# Patient Record
Sex: Male | Born: 1960
Health system: Southern US, Community
[De-identification: ages and names within clinical notes are randomized; demographics above are authoritative.]

## PROBLEM LIST (undated history)

## (undated) DIAGNOSIS — I10 Essential (primary) hypertension: Secondary | ICD-10-CM

## (undated) DIAGNOSIS — E785 Hyperlipidemia, unspecified: Secondary | ICD-10-CM

## (undated) DIAGNOSIS — Z789 Other specified health status: Secondary | ICD-10-CM

## (undated) HISTORY — PX: KNEE ARTHROSCOPY: SUR90

## (undated) HISTORY — PX: WISDOM TOOTH EXTRACTION: SHX21

## (undated) HISTORY — DX: Other specified health status: Z78.9

## (undated) HISTORY — DX: Hyperlipidemia, unspecified: E78.5

## (undated) HISTORY — DX: Essential (primary) hypertension: I10

---

## 1989-03-02 HISTORY — PX: KNEE ARTHROSCOPY: SUR90

## 2016-06-26 DIAGNOSIS — F17200 Nicotine dependence, unspecified, uncomplicated: Secondary | ICD-10-CM | POA: Diagnosis not present

## 2016-06-26 DIAGNOSIS — Z681 Body mass index (BMI) 19 or less, adult: Secondary | ICD-10-CM | POA: Insufficient documentation

## 2016-06-26 DIAGNOSIS — L989 Disorder of the skin and subcutaneous tissue, unspecified: Secondary | ICD-10-CM | POA: Diagnosis not present

## 2016-07-16 DIAGNOSIS — H61002 Unspecified perichondritis of left external ear: Secondary | ICD-10-CM | POA: Diagnosis not present

## 2017-03-04 ENCOUNTER — Ambulatory Visit (INDEPENDENT_AMBULATORY_CARE_PROVIDER_SITE_OTHER): Payer: Self-pay

## 2017-03-04 ENCOUNTER — Encounter (INDEPENDENT_AMBULATORY_CARE_PROVIDER_SITE_OTHER): Payer: Self-pay | Admitting: Orthopaedic Surgery

## 2017-03-04 ENCOUNTER — Ambulatory Visit (INDEPENDENT_AMBULATORY_CARE_PROVIDER_SITE_OTHER): Payer: BLUE CROSS/BLUE SHIELD | Admitting: Orthopaedic Surgery

## 2017-03-04 VITALS — BP 156/88 | HR 93 | Resp 14 | Ht 67.0 in | Wt 165.0 lb

## 2017-03-04 DIAGNOSIS — M11262 Other chondrocalcinosis, left knee: Secondary | ICD-10-CM

## 2017-03-04 DIAGNOSIS — M25562 Pain in left knee: Secondary | ICD-10-CM

## 2017-03-04 MED ORDER — BUPIVACAINE HCL 0.5 % IJ SOLN
2.0000 mL | INTRAMUSCULAR | Status: AC | PRN
  Administered 2017-03-04: 2 mL via INTRA_ARTICULAR

## 2017-03-04 MED ORDER — METHYLPREDNISOLONE ACETATE 40 MG/ML IJ SUSP
80.0000 mg | INTRAMUSCULAR | Status: AC | PRN
  Administered 2017-03-04: 80 mg

## 2017-03-04 MED ORDER — LIDOCAINE HCL 1 % IJ SOLN
2.0000 mL | INTRAMUSCULAR | Status: AC | PRN
  Administered 2017-03-04: 2 mL

## 2017-03-04 MED ORDER — DICLOFENAC SODIUM 1 % TD GEL: 2.0000 g | Freq: Four times a day (QID) | TRANSDERMAL | 1 refills | Status: DC

## 2017-03-04 NOTE — Progress Notes (Signed)
Office Visit Note   Patient: Charles Hudson           Date of Birth: Jul 30, 1960           MRN: [id number] Visit Date: 03/04/2017              Requested by: No referring provider defined for this encounter. PCP: Patient, No Pcp Per   Assessment & Plan: Visit Diagnoses:  1. Chondrocalcinosis due to dicalcium phosphate crystals, of the knee, left   2. Acute pain of left knee     Plan:  #1: Corticosteroid injection was given to the left knee without difficulty. Improved pain. #2: Prescription for Voltaren gel that he can use intermittently as necessary. #3: If he is not improving then may consider MRI scan   Follow-Up Instructions: Return if symptoms worsen or fail to improve.   Orders:  Orders Placed This Encounter  Procedures  . XR Knee Complete 4 Views Left   Meds ordered this encounter  Medications  . diclofenac sodium (VOLTAREN) 1 % GEL    Sig: Apply 2-4 g topically 4 (four) times daily.    Dispense:  5 Tube    Refill:  1    Order Specific Question:   Supervising Provider    Answer:   Garald Balding [[id number]]      Procedures: Large Joint Inj: L knee on 03/04/2017 4:16 PM Indications: pain and diagnostic evaluation Details: 25 G 1.5 in needle, anteromedial approach  Arthrogram: No  Medications: 2 mL lidocaine 1 %; 2 mL bupivacaine 0.5 %; 80 mg methylPREDNISolone acetate 40 MG/ML Procedure, treatment alternatives, risks and benefits explained, specific risks discussed. Consent was given by the patient. Patient was prepped and draped in the usual sterile fashion.       Clinical Data: No additional findings.   Subjective: Chief Complaint  Patient presents with  . Left Knee - Pain, Edema    Charles Hudson is a 57 y o here today for Left knee pain x 2 weeks. Hx of Left knee meniscus repair and fatpad    HPI  [name] Is a very pleasant 57 year old white male who is seen today for evaluation of his left knee. [name] had a history of about 2 weeks of pain in the  medial aspect of his his left knee. Denies any recent history of injury or trauma. Denies any swelling at this time. Does not have any mechanical symptoms per se. The with his work it has been bothering him. He states nighttime is also where he has pain but then after several hours will improve.  Review of Systems  Constitutional: Negative for fatigue.  HENT: Negative for hearing loss.   Respiratory: Negative for apnea, chest tightness and shortness of breath.   Cardiovascular: Negative for chest pain, palpitations and leg swelling.  Gastrointestinal: Negative for blood in stool, constipation and diarrhea.  Genitourinary: Negative for difficulty urinating.  Musculoskeletal: Negative for arthralgias, back pain, joint swelling, myalgias, neck pain and neck stiffness.  Neurological: Negative for weakness, numbness and headaches.  Hematological: Does not bruise/bleed easily.  Psychiatric/Behavioral: Negative for sleep disturbance. The patient is not nervous/anxious.      Objective: Vital Signs: BP (!) 156/88   Pulse 93   Resp 14   Ht 5\' 7"  (1.702 m)   Wt 165 lb (74.8 kg)   BMI 25.84 kg/m   Physical Exam  Constitutional: He is oriented to person, place, and time. He appears well-developed and well-nourished.  HENT:  Head: Normocephalic  and atraumatic.  Eyes: EOM are normal. Pupils are equal, round, and reactive to light.  Pulmonary/Chest: Effort normal.  Neurological: He is alert and oriented to person, place, and time.  Skin: Skin is warm and dry.  Psychiatric: He has a normal mood and affect. His behavior is normal. Judgment and thought content normal.    Ortho Exam Exam today reveals no real effusion. Mottling in all the extremities. Range of motion from near full extension to 125. Tenderness over the medial compartment only at the joint line. No patellofemoral crepitance noted. McMurray's does give him a little bit of pain at the medial joint line. No pain laterally at all. His  cardiac smooth motion of both hips. No crepitance.   Specialty Comments:  No specialty comments available.  Imaging: Xr Knee Complete 4 Views Left  Result Date: 03/04/2017 4 view x-ray of the left knee reveals maintenance of the joint space. There is some sclerosing more at the medial tibial plateau. There are signs of chondrocalcinosis with calcification in the medial meniscus. Slight decrease in the medial joint line.    PMFS History: There are no active problems to display for this patient.  History reviewed. No pertinent past medical history.  Family History  Problem Relation Age of Onset  . Alzheimer's disease Father     Past Surgical History:  Procedure Laterality Date  . KNEE ARTHROSCOPY  1991  . KNEE ARTHROSCOPY    . WISDOM TOOTH EXTRACTION     Social History   Occupational History  . Not on file  Tobacco Use  . Smoking status: Current Every Day Smoker  . Smokeless tobacco: Never Used  Substance and Sexual Activity  . Alcohol use: Yes  . Drug use: No  . Sexual activity: Not on file

## 2017-03-05 ENCOUNTER — Telehealth: Payer: Self-pay

## 2017-03-05 ENCOUNTER — Other Ambulatory Visit (INDEPENDENT_AMBULATORY_CARE_PROVIDER_SITE_OTHER): Payer: Self-pay

## 2017-03-05 MED ORDER — DICLOFENAC SODIUM 1 % TD GEL: 2.0000 g | Freq: Four times a day (QID) | TRANSDERMAL | 1 refills | Status: DC

## 2017-03-05 NOTE — Telephone Encounter (Signed)
Resent today.

## 2017-03-05 NOTE — Telephone Encounter (Signed)
Patient called stating that Rx for Voltaren gel had not been received by pharmacy.  Pharmacy is [facility] in [place] on [address]

## 2017-03-11 ENCOUNTER — Telehealth (INDEPENDENT_AMBULATORY_CARE_PROVIDER_SITE_OTHER): Payer: Self-pay | Admitting: Orthopaedic Surgery

## 2017-03-11 NOTE — Telephone Encounter (Signed)
Patient's wife called stating they were told a prior authorization is required for their prescription of Voltarin Gel.  The Pharmacy they use is [facility] in [place] on [address].  Patient's CB# [phone]

## 2017-03-16 NOTE — Telephone Encounter (Signed)
LVMOM to call [facility] before  Pick up to make sure PA worked

## 2017-03-18 ENCOUNTER — Ambulatory Visit (INDEPENDENT_AMBULATORY_CARE_PROVIDER_SITE_OTHER): Payer: BLUE CROSS/BLUE SHIELD | Admitting: Orthopaedic Surgery

## 2017-03-18 ENCOUNTER — Other Ambulatory Visit (INDEPENDENT_AMBULATORY_CARE_PROVIDER_SITE_OTHER): Payer: Self-pay

## 2017-03-18 ENCOUNTER — Encounter (INDEPENDENT_AMBULATORY_CARE_PROVIDER_SITE_OTHER): Payer: Self-pay | Admitting: Orthopaedic Surgery

## 2017-03-18 VITALS — BP 154/88 | HR 90 | Resp 14 | Ht 67.0 in | Wt 125.0 lb

## 2017-03-18 DIAGNOSIS — M25562 Pain in left knee: Secondary | ICD-10-CM | POA: Diagnosis not present

## 2017-03-18 DIAGNOSIS — G8929 Other chronic pain: Secondary | ICD-10-CM

## 2017-03-18 NOTE — Progress Notes (Signed)
Office Visit Note   Patient: Charles Hudson           Date of Birth: 21-May-1960           MRN: [id number] Visit Date: 03/18/2017              Requested by: No referring provider defined for this encounter. PCP: Patient, No Pcp Per   Assessment & Plan: Visit Diagnoses:  1. Chronic pain of left knee     Plan: At least 2 month history of some left knee pain. Seen several weeks ago with evidence of chondrocalcinosis on the plain film. Given cortisone injection only lasted "a day". Has had several episodes of significant pain in his knee since that time to the point of compromise. [name] having quite a bit of pain in the posterior medial corner I'm concerned that he has a tear of the medial meniscus. He has a significant limp. I think at this point that he's had trouble for several months with treatment that's not responsive to cortisone and time and over-the-counter medicines . I think he has some significant intra-articular pathology and we will order an MRI scan. I think he is going to be candidate for knee arthroscopy  Follow-Up Instructions: Return after MRI.   Orders:  No orders of the defined types were placed in this encounter.  No orders of the defined types were placed in this encounter.     Procedures: No procedures performed   Clinical Data: No additional findings.   Subjective: Chief Complaint  Patient presents with  . Left Knee - Pain  . Knee Pain    Inj x 2 weeks, helped 1 day, worse now, swollen, red, difficulty walking, difficulty standing, no injury, not diabetic, arthroscopic x 2 1991, IBU helps  Tommy is had some trouble with his left knee for several months. He does a lot of work bending stooping and squatting but does not remember a specific injury. He was seen several weeks ago with a cortisone injection only lasted "a day". There was evidence of chondrocalcinosis by film he does not have an effusion but having quite a bit of pain particularly with  hyperflexion on the posterior medial corner of his knee. [name] had significant compromise of his work related activities and even try and find a comfortable position sleeping.  HPI  Review of Systems  Constitutional: Positive for activity change.  HENT: Negative for trouble swallowing.   Eyes: Negative for redness.  Respiratory: Negative for shortness of breath.   Cardiovascular: Positive for leg swelling.  Gastrointestinal: Negative for constipation.  Endocrine: Negative for cold intolerance.  Genitourinary: Negative for difficulty urinating.  Musculoskeletal: Positive for joint swelling.  Skin: Negative for rash.  Allergic/Immunologic: Negative for food allergies.  Neurological: Negative for weakness.  Hematological: Does not bruise/bleed easily.  Psychiatric/Behavioral: Negative for sleep disturbance.     Objective: Vital Signs: BP (!) 154/88 (BP Location: Left Arm, Patient Position: Sitting, Cuff Size: Normal)   Pulse 90   Resp 14   Ht 5\' 7"  (1.702 m)   Wt 125 lb (56.7 kg)   BMI 19.58 kg/m   Physical Exam  Ortho Exam awake alert and oriented 3. Comfortable sitting. Right knee without effusion. Has definite posterior medial corner discomfort. No instability. Little bit of patellar crepitation but no pain. No popliteal mass. No calf pain. Neurovascular exam intact. He does have quite a bit of pain when I extend his knee but no instability  Specialty Comments:  No specialty  comments available.  Imaging: No results found.   PMFS History: There are no active problems to display for this patient.  History reviewed. No pertinent past medical history.  Family History  Problem Relation Age of Onset  . Alzheimer's disease Father     Past Surgical History:  Procedure Laterality Date  . KNEE ARTHROSCOPY  1991  . KNEE ARTHROSCOPY    . WISDOM TOOTH EXTRACTION     Social History   Occupational History  . Not on file  Tobacco Use  . Smoking status: Current Every Day  Smoker    Packs/day: 1.50    Years: 30.00    Pack years: 45.00    Types: Cigarettes  . Smokeless tobacco: Never Used  Substance and Sexual Activity  . Alcohol use: Yes    Alcohol/week: 16.8 oz    Types: 28 Cans of beer per week  . Drug use: No  . Sexual activity: Not on file

## 2017-03-29 ENCOUNTER — Ambulatory Visit
Admission: RE | Admit: 2017-03-29 | Discharge: 2017-03-29 | Disposition: A | Discharge status: Home / Self Care | Payer: BLUE CROSS/BLUE SHIELD | Source: Ambulatory Visit | Attending: Orthopaedic Surgery | Admitting: Orthopaedic Surgery

## 2017-03-29 ENCOUNTER — Telehealth (INDEPENDENT_AMBULATORY_CARE_PROVIDER_SITE_OTHER): Payer: Self-pay | Admitting: Orthopaedic Surgery

## 2017-03-29 DIAGNOSIS — G8929 Other chronic pain: Secondary | ICD-10-CM

## 2017-03-29 DIAGNOSIS — M25562 Pain in left knee: Secondary | ICD-10-CM | POA: Diagnosis not present

## 2017-03-29 NOTE — Telephone Encounter (Signed)
Patient's wife Ivin Booty left a voicemail returning a call to the office.  She will have her cell phone with her if you are able to call back.  CB# [phone]

## 2017-04-01 ENCOUNTER — Telehealth (INDEPENDENT_AMBULATORY_CARE_PROVIDER_SITE_OTHER): Payer: Self-pay | Admitting: Orthopaedic Surgery

## 2017-04-01 NOTE — Telephone Encounter (Signed)
Patient's wife Ivin Booty left a voicemail stating she missed the call from Dr. Durward Fortes about Tommy's MRI.  She stated that she will keep her phone with her today so she doesn't miss another call.

## 2017-04-05 ENCOUNTER — Encounter (INDEPENDENT_AMBULATORY_CARE_PROVIDER_SITE_OTHER): Payer: Self-pay | Admitting: Orthopaedic Surgery

## 2017-04-05 ENCOUNTER — Ambulatory Visit (INDEPENDENT_AMBULATORY_CARE_PROVIDER_SITE_OTHER): Payer: BLUE CROSS/BLUE SHIELD | Admitting: Orthopaedic Surgery

## 2017-04-05 VITALS — BP 149/83 | HR 88 | Resp 12 | Ht 66.0 in | Wt 125.0 lb

## 2017-04-05 DIAGNOSIS — M25562 Pain in left knee: Secondary | ICD-10-CM | POA: Diagnosis not present

## 2017-04-05 DIAGNOSIS — G8929 Other chronic pain: Secondary | ICD-10-CM | POA: Diagnosis not present

## 2017-04-05 NOTE — Progress Notes (Signed)
Office Visit Note   Patient: Charles Hudson           Date of Birth: 02-Aug-1960           MRN: [id number] Visit Date: 04/05/2017              Requested by: No referring provider defined for this encounter. PCP: Patient, No Pcp Per   Assessment & Plan: Visit Diagnoses:  1. Chronic pain of left knee     Plan: MRI scan demonstrates a horizontal tear of the posterior horn and body of the medial meniscus extending to the superior articular surface. There is also partial thickness cartilage loss of the medial femoral tibial compartment. Problem is localized along the posterior medial compartment left knee consistent with the MRI scan findings. Long discussion regarding the MRI scan. Tommy would like to proceed with a knee arthroscopy. Discussed this in some detail. We'll schedule  Follow-Up Instructions: Return will schedule left knee arthroscopy.   Orders:  No orders of the defined types were placed in this encounter.  No orders of the defined types were placed in this encounter.     Procedures: No procedures performed   Clinical Data: No additional findings.   Subjective: Chief Complaint  Patient presents with  . Left Knee - Pain, Results, Weakness    Charles Hudson is a 57 y o here today for MRI results left knee. Hx of left knee arthroscopy in 1991.  Several month history of left knee pain and has evidence of chondrocalcinosis on plain film. Cortisone injection was not helpful. Has had an MRI scan demonstrating a horizontal tear of the medial meniscus associated with some cartilage loss in the medial compartment. It seems that his pain is more related to the meniscal tear. Has tried over-the-counter medicines and even a knee support.  HPI  Review of Systems   Objective: Vital Signs: BP (!) 149/83   Pulse 88   Resp 12   Ht 5\' 6"  (1.676 m)   Wt 125 lb (56.7 kg)   BMI 20.18 kg/m   Physical Exam  Ortho Exam awake alert and oriented 3. Comfortable sitting. No left knee  effusion. Predominantly posterior medial joint pain. No pain anterior medially or laterally. No patella crepitation. Full knee extension and flexed over 105. No instability. No popliteal pain. No calf pain. Neurovascular exam intact  Specialty Comments:  No specialty comments available.  Imaging: No results found.   PMFS History: There are no active problems to display for this patient.  History reviewed. No pertinent past medical history.  Family History  Problem Relation Age of Onset  . Alzheimer's disease Father     Past Surgical History:  Procedure Laterality Date  . KNEE ARTHROSCOPY  1991  . KNEE ARTHROSCOPY    . WISDOM TOOTH EXTRACTION     Social History   Occupational History  . Not on file  Tobacco Use  . Smoking status: Current Every Day Smoker    Packs/day: 1.50    Years: 30.00    Pack years: 45.00    Types: Cigarettes  . Smokeless tobacco: Never Used  Substance and Sexual Activity  . Alcohol use: Yes    Alcohol/week: 16.8 oz    Types: 28 Cans of beer per week  . Drug use: No  . Sexual activity: Not on file     Garald Balding, MD   Note - This record has been created using [employer].  Chart creation errors have been sought,  but may not always  have been located. Such creation errors do not reflect on  the standard of medical care.

## 2017-04-15 ENCOUNTER — Encounter: Payer: Self-pay | Admitting: Orthopaedic Surgery

## 2017-04-15 DIAGNOSIS — G8918 Other acute postprocedural pain: Secondary | ICD-10-CM | POA: Diagnosis not present

## 2017-04-15 DIAGNOSIS — M94262 Chondromalacia, left knee: Secondary | ICD-10-CM | POA: Diagnosis not present

## 2017-04-15 DIAGNOSIS — M659 Synovitis and tenosynovitis, unspecified: Secondary | ICD-10-CM | POA: Diagnosis not present

## 2017-04-15 DIAGNOSIS — M23321 Other meniscus derangements, posterior horn of medial meniscus, right knee: Secondary | ICD-10-CM | POA: Diagnosis not present

## 2017-04-15 DIAGNOSIS — M23222 Derangement of posterior horn of medial meniscus due to old tear or injury, left knee: Secondary | ICD-10-CM | POA: Diagnosis not present

## 2017-04-15 DIAGNOSIS — M6752 Plica syndrome, left knee: Secondary | ICD-10-CM | POA: Diagnosis not present

## 2017-04-15 DIAGNOSIS — M11162 Familial chondrocalcinosis, left knee: Secondary | ICD-10-CM | POA: Diagnosis not present

## 2017-04-19 ENCOUNTER — Encounter (INDEPENDENT_AMBULATORY_CARE_PROVIDER_SITE_OTHER): Payer: Self-pay | Admitting: Orthopaedic Surgery

## 2017-04-19 ENCOUNTER — Ambulatory Visit (INDEPENDENT_AMBULATORY_CARE_PROVIDER_SITE_OTHER): Payer: BLUE CROSS/BLUE SHIELD | Admitting: Orthopaedic Surgery

## 2017-04-19 VITALS — BP 132/76 | HR 106 | Resp 14 | Ht 69.0 in | Wt 125.0 lb

## 2017-04-19 DIAGNOSIS — M25562 Pain in left knee: Secondary | ICD-10-CM

## 2017-04-19 DIAGNOSIS — G8929 Other chronic pain: Secondary | ICD-10-CM

## 2017-04-19 NOTE — Progress Notes (Signed)
Office Visit Note   Patient: Charles Hudson           Date of Birth: 28-Jul-1960           MRN: [id number] Visit Date: 04/19/2017              Requested by: No referring provider defined for this encounter. PCP: Patient, No Pcp Per   Assessment & Plan: Visit Diagnoses:  1. Chronic pain of left knee     Plan: 4 days status post left knee arthroscopy. Had tear of the medial meniscus that was debrided. Actually doing quite well and not having much pain. Having some difficulty with his shoulders with use of the walker. Activities as tolerated office 2 weeks. No work  Follow-Up Instructions: Return in about 2 weeks (around 05/03/2017).   Orders:  No orders of the defined types were placed in this encounter.  No orders of the defined types were placed in this encounter.     Procedures: No procedures performed   Clinical Data: No additional findings.   Subjective: Chief Complaint  Patient presents with  . Left Knee - Routine Post Op    Charles Hudson is a 57 y o S/P 4 days Left knee arthroscopy.  He relates no real pain, no fever, chills or calf pain. Take ibuprofen PRN for swelling.  No fever or chills shortness of breath or chest pain. Not taking pain medicines.  HPI  Review of Systems  Constitutional: Negative for fatigue.  HENT: Negative for hearing loss.   Respiratory: Negative for apnea, chest tightness and shortness of breath.   Cardiovascular: Negative for chest pain, palpitations and leg swelling.  Gastrointestinal: Negative for blood in stool, constipation and diarrhea.  Genitourinary: Negative for difficulty urinating.  Musculoskeletal: Negative for arthralgias, back pain, joint swelling, myalgias, neck pain and neck stiffness.  Neurological: Negative for weakness, numbness and headaches.  Hematological: Does not bruise/bleed easily.  Psychiatric/Behavioral: Negative for sleep disturbance. The patient is not nervous/anxious.      Objective: Vital Signs: BP  132/76   Pulse (!) 106   Resp 14   Ht 5\' 9"  (1.753 m)   Wt 125 lb (56.7 kg)   BMI 18.46 kg/m   Physical Exam  Ortho Exam awake alert and oriented 3. Comfortable sitting. Walks slowly with a walker. He is applying partial weightbearing to the left lower extremity. Arthroscopic portals are clean and dry. Small effusion. Full extension about 95 of flexion. No calf pain. No popliteal discomfort. No distal edema.  Specialty Comments:  No specialty comments available.  Imaging: No results found.   PMFS History: There are no active problems to display for this patient.  History reviewed. No pertinent past medical history.  Family History  Problem Relation Age of Onset  . Alzheimer's disease Father     Past Surgical History:  Procedure Laterality Date  . KNEE ARTHROSCOPY  1991  . KNEE ARTHROSCOPY    . WISDOM TOOTH EXTRACTION     Social History   Occupational History  . Not on file  Tobacco Use  . Smoking status: Current Every Day Smoker    Packs/day: 1.50    Years: 30.00    Pack years: 45.00    Types: Cigarettes  . Smokeless tobacco: Never Used  Substance and Sexual Activity  . Alcohol use: Yes    Alcohol/week: 16.8 oz    Types: 28 Cans of beer per week  . Drug use: No  . Sexual activity: Not on file

## 2017-05-03 ENCOUNTER — Ambulatory Visit (INDEPENDENT_AMBULATORY_CARE_PROVIDER_SITE_OTHER): Payer: BLUE CROSS/BLUE SHIELD | Admitting: Orthopaedic Surgery

## 2017-05-03 ENCOUNTER — Encounter (INDEPENDENT_AMBULATORY_CARE_PROVIDER_SITE_OTHER): Payer: Self-pay | Admitting: Orthopaedic Surgery

## 2017-05-03 VITALS — BP 139/76 | HR 89 | Resp 14 | Ht 66.0 in | Wt 122.0 lb

## 2017-05-03 DIAGNOSIS — G8929 Other chronic pain: Secondary | ICD-10-CM

## 2017-05-03 DIAGNOSIS — M25562 Pain in left knee: Secondary | ICD-10-CM

## 2017-05-03 NOTE — Progress Notes (Signed)
Office Visit Note   Patient: Charles Hudson           Date of Birth: 1960-03-21           MRN: [id number] Visit Date: 05/03/2017              Requested by: No referring provider defined for this encounter. PCP: Patient, No Pcp Per   Assessment & Plan: Visit Diagnoses:  1. Chronic pain of left knee     Plan: 2-1/2 weeks status post left knee arthroscopy with debridement of a medial meniscal tear. Doing well. No longer uses any ambulatory aid. Needs to work on range of motion and strengthening exercises. We will keep him out of work until he returns to see me in 2 weeks. Patient does not wish to go to physical therapy but will do his exercises at home  Follow-Up Instructions: Return in about 2 weeks (around 05/17/2017).   Orders:  No orders of the defined types were placed in this encounter.  No orders of the defined types were placed in this encounter.     Procedures: No procedures performed   Clinical Data: No additional findings.   Subjective: Chief Complaint  Patient presents with  . Left Knee - Routine Post Op    Charles Hudson is 32 for 2 week f u post op surgery of l knee. No issues.  2-1/2 weeks status post left knee arthroscopy for debridement of a medial meniscal tear. Presently doing well with minimal pain. No fever or chills shortness of breath or chest pain. Has been out of work since before surgery.  HPI  Review of Systems  Constitutional: Negative for fatigue and fever.  HENT: Negative for ear pain and tinnitus.   Eyes: Negative for pain.  Respiratory: Negative for cough and shortness of breath.   Gastrointestinal: Negative for blood in stool, constipation and diarrhea.  Musculoskeletal: Negative for back pain and neck pain.  Allergic/Immunologic: Negative for food allergies.  Neurological: Negative for weakness and numbness.  Hematological: Does not bruise/bleed easily.     Objective: Vital Signs: BP 139/76 (BP Location: Left Arm, Patient Position:  Sitting, Cuff Size: Normal)   Pulse 89   Resp 14   Ht 5\' 6"  (1.676 m)   Wt 122 lb (55.3 kg)   BMI 19.69 kg/m   Physical Exam  Ortho Exam awake alert and oriented 3. Comfortable sitting left knee with minimal effusion. Arthroscopic portals healing without problem. No calf pain. No distal edema. Lacks just a few degrees to full extension and flexed over 90. No instability. No localizers of tenderness. He does have some weakness of the quads  Specialty Comments:  No specialty comments available.  Imaging: No results found.   PMFS History: There are no active problems to display for this patient.  History reviewed. No pertinent past medical history.  Family History  Problem Relation Age of Onset  . Alzheimer's disease Father     Past Surgical History:  Procedure Laterality Date  . KNEE ARTHROSCOPY  1991  . KNEE ARTHROSCOPY    . WISDOM TOOTH EXTRACTION     Social History   Occupational History  . Not on file  Tobacco Use  . Smoking status: Current Every Day Smoker    Packs/day: 1.50    Years: 30.00    Pack years: 45.00    Types: Cigarettes  . Smokeless tobacco: Never Used  Substance and Sexual Activity  . Alcohol use: Yes    Alcohol/week: 16.8 oz  Types: 28 Cans of beer per week  . Drug use: No  . Sexual activity: Not on file

## 2017-05-17 ENCOUNTER — Ambulatory Visit (INDEPENDENT_AMBULATORY_CARE_PROVIDER_SITE_OTHER): Payer: BLUE CROSS/BLUE SHIELD | Admitting: Orthopaedic Surgery

## 2017-05-17 ENCOUNTER — Encounter (INDEPENDENT_AMBULATORY_CARE_PROVIDER_SITE_OTHER): Payer: Self-pay | Admitting: Orthopaedic Surgery

## 2017-05-17 VITALS — BP 158/93 | HR 89 | Resp 18 | Ht 66.0 in | Wt 125.0 lb

## 2017-05-17 DIAGNOSIS — G8929 Other chronic pain: Secondary | ICD-10-CM

## 2017-05-17 DIAGNOSIS — M25562 Pain in left knee: Secondary | ICD-10-CM

## 2017-05-17 NOTE — Progress Notes (Signed)
Office Visit Note   Patient: Charles Hudson           Date of Birth: 10/02/1960           MRN: [id number] Visit Date: 05/17/2017              Requested by: No referring provider defined for this encounter. PCP: Patient, No Pcp Per   Assessment & Plan: Visit Diagnoses:  1. Chronic pain of left knee     Plan: Over 4 weeks status post left knee arthroscopy with partial medial meniscectomy.  Doing well.  Would like to return to work March 25.  Will issue note to that effect and plan to see him back as needed  Follow-Up Instructions: Return if symptoms worsen or fail to improve.   Orders:  No orders of the defined types were placed in this encounter.  No orders of the defined types were placed in this encounter.     Procedures: No procedures performed   Clinical Data: No additional findings.   Subjective: Chief Complaint  Patient presents with  . Left Knee - Pain    MT Shapley IS 57 Y O M HERE FOR LEFT KNEE PAIN 04-15-17 NO ISSUES.   . Right Shoulder - Pain    MR Ossa IS 30 Y O M HERE FOR BIL LAT SHOULDER PAIN NO INJURY, STARTED BOTHERING HIM BEFORE HIS KNEE SUR 04-15-17.  Marland Kitchen Left Shoulder - Pain    MR Deboard IS 24 Y O M HERE FOR BIL LAT SHOULDER PAIN NO INJURY, STARTED BOTHERING HIM BEFORE HIS KNEE SUR 04-15-17.  Are without related issues over a month status post left knee arthroscopy for tear of the medial meniscus.  Not having any appreciable pain.  Using any ambulatory aid.  Not taking any pain meds  HPI  Review of Systems  Constitutional: Negative for fatigue and fever.  HENT: Negative for ear pain.   Eyes: Negative for pain.  Respiratory: Negative for cough and shortness of breath.   Cardiovascular: Negative for leg swelling.  Gastrointestinal: Negative for blood in stool, constipation and diarrhea.  Genitourinary: Negative for dysuria.  Musculoskeletal: Negative for back pain and neck pain.  Skin: Negative for rash and wound.  Allergic/Immunologic: Negative for  food allergies.  Neurological: Negative for dizziness, weakness, light-headedness, numbness and headaches.  Hematological: Does not bruise/bleed easily.  Psychiatric/Behavioral: Negative for sleep disturbance.     Objective: Vital Signs: BP (!) 158/93 (BP Location: Left Arm, Patient Position: Sitting, Cuff Size: Normal)   Pulse 89   Resp 18   Ht 5\' 6"  (1.676 m)   Wt 125 lb (56.7 kg)   BMI 20.18 kg/m   Physical Exam  Ortho Exam left knee without effusion.  Arthroscopic portals of healed without problem.  No medial lateral joint pain.  No instability.  Flexed over 110 degrees without instability.  Full extension.  Neurovascular exam intact  Specialty Comments:  No specialty comments available.  Imaging: No results found.   PMFS History: There are no active problems to display for this patient.  History reviewed. No pertinent past medical history.  Family History  Problem Relation Age of Onset  . Alzheimer's disease Father     Past Surgical History:  Procedure Laterality Date  . KNEE ARTHROSCOPY  1991  . KNEE ARTHROSCOPY    . WISDOM TOOTH EXTRACTION     Social History   Occupational History  . Not on file  Tobacco Use  . Smoking status: Current Every  Day Smoker    Packs/day: 1.50    Years: 30.00    Pack years: 45.00    Types: Cigarettes  . Smokeless tobacco: Never Used  Substance and Sexual Activity  . Alcohol use: Yes    Alcohol/week: 16.8 oz    Types: 28 Cans of beer per week  . Drug use: No  . Sexual activity: Not on file

## 2017-12-13 ENCOUNTER — Ambulatory Visit: Payer: BLUE CROSS/BLUE SHIELD | Admitting: Family Medicine

## 2017-12-27 ENCOUNTER — Ambulatory Visit: Payer: BLUE CROSS/BLUE SHIELD | Admitting: Family Medicine

## 2017-12-27 ENCOUNTER — Encounter: Payer: Self-pay | Admitting: Family Medicine

## 2017-12-27 VITALS — BP 149/84 | HR 78 | Temp 98.6°F | Resp 16 | Ht 66.0 in | Wt 125.0 lb

## 2017-12-27 DIAGNOSIS — Z720 Tobacco use: Secondary | ICD-10-CM | POA: Diagnosis not present

## 2017-12-27 DIAGNOSIS — Z125 Encounter for screening for malignant neoplasm of prostate: Secondary | ICD-10-CM | POA: Diagnosis not present

## 2017-12-27 DIAGNOSIS — F17210 Nicotine dependence, cigarettes, uncomplicated: Secondary | ICD-10-CM | POA: Insufficient documentation

## 2017-12-27 DIAGNOSIS — Z23 Encounter for immunization: Secondary | ICD-10-CM

## 2017-12-27 DIAGNOSIS — Z1211 Encounter for screening for malignant neoplasm of colon: Secondary | ICD-10-CM

## 2017-12-27 DIAGNOSIS — N5089 Other specified disorders of the male genital organs: Secondary | ICD-10-CM

## 2017-12-27 DIAGNOSIS — Z0000 Encounter for general adult medical examination without abnormal findings: Secondary | ICD-10-CM

## 2017-12-27 DIAGNOSIS — R030 Elevated blood-pressure reading, without diagnosis of hypertension: Secondary | ICD-10-CM

## 2017-12-27 NOTE — Progress Notes (Signed)
Chief Complaint  Patient presents with  . New Patient (Initial Visit)    here to stablish care    Well Male Charles Hudson is here for a complete physical.   His last physical was >1 year ago.  Current diet: in general, a "good" diet.  Current exercise: physically active at work Weight trend: stable Daytime fatigue? No. Seat belt? Yes.    Health maintenance Shingrix- No Colonoscopy- No Tetanus- No HIV- Yes - 1987 Hep C- Yes -1987 Lung cancer screening- No Prostate cancer screening- No   History reviewed. No pertinent past medical history.    Past Surgical History:  Procedure Laterality Date  . KNEE ARTHROSCOPY  1991  . KNEE ARTHROSCOPY    . WISDOM TOOTH EXTRACTION      Medications  Takes no meds routinely.   Allergies No Known Allergies  Family History Family History  Problem Relation Age of Onset  . Alzheimer's disease Father     Review of Systems: Constitutional:  no fevers Eye:  no recent significant change in vision Ear/Nose/Mouth/Throat:  Ears:  no hearing loss Nose/Mouth/Throat:  no complaints of nasal congestion, no sore throat Cardiovascular:  no chest pain, no palpitations Respiratory:  no cough and no shortness of breath Gastrointestinal:  no abdominal pain, no change in bowel habits GU:  Male: negative for dysuria, frequency, and incontinence and negative for prostate symptoms Musculoskeletal/Extremities:  no pain, redness, or swelling of the joints Integumentary (Skin/Breast):  no abnormal skin lesions reported Neurologic:  no headaches Endocrine: No unexpected weight changes Hematologic/Lymphatic:  no abnormal bleeding  Exam BP (!) 149/84 (BP Location: Right Arm, Patient Position: Sitting, Cuff Size: Small)   Pulse 78   Temp 98.6 F (37 C) (Oral)   Resp 16   Ht 5\' 6"  (1.676 m)   Wt 125 lb (56.7 kg)   SpO2 100%   BMI 20.18 kg/m  General:  well developed, well nourished, in no apparent distress Skin:  no significant moles, warts, or  growths Head:  no masses, lesions, or tenderness Eyes:  pupils equal and round, sclera anicteric without injection Ears:  canals without lesions, TMs shiny without retraction, no obvious effusion, no erythema Nose:  nares patent, septum midline, mucosa normal Throat/Pharynx:  lips and gingiva without lesion; tongue and uvula midline; non-inflamed pharynx; no exudates or postnasal drainage Neck: neck supple without adenopathy, thyromegaly, or masses Lungs:  clear to auscultation, breath sounds equal bilaterally, no respiratory distress Cardio:  regular rate and rhythm, no LE edema, no bruits Abdomen:  abdomen soft, nontender; bowel sounds normal; no masses or organomegaly Genital (male): circumcised penis, no lesions or discharge; testes present bilaterally with L sided mass, no tenderness Rectal: Deferred Musculoskeletal:  symmetrical muscle groups noted without atrophy or deformity Extremities:  no clubbing, cyanosis, or edema, no deformities, no skin discoloration Neuro:  gait normal; deep tendon reflexes normal and symmetric Psych: well oriented with normal range of affect and appropriate judgment/insight  Assessment and Plan  Well adult exam - Plan: Lipid panel, Comprehensive metabolic panel  Tobacco abuse  Screen for colon cancer - Plan: Ambulatory referral to Gastroenterology  Smoking greater than 30 pack years - Plan: CT CHEST LUNG CANCER SCREENING LOW DOSE WO CONTRAST  Need for diphtheria-tetanus-pertussis (Tdap) vaccine - Plan: Tdap vaccine greater than or equal to 7yo IM  Testicular mass - Plan: US Scrotum  Elevated blood pressure reading  Screening for prostate cancer - Plan: PSA   [name]. Counseled on diet and exercise.  Counseled on risks and benefits of prostate cancer screening with PSA. The patient agrees to undergo testing. Immunizations, labs, and further orders as above. Ck [place] for testicular mass, likely vessel related. Counseled on tobacco  cessation.  I offered both Chantix and nicotine supplementation which he declined at this time wishing to speak with his wife first. He accepted the tetanus shot but refused a flu shot.  Also refused pneumonia vaccine stating he only wanted one poke today. Follow up in 1 yr pending above. The patient voiced understanding and agreement to the plan.   Sabana Eneas, DO 12/27/17 4:41 PM

## 2017-12-27 NOTE — Patient Instructions (Addendum)
Give [place] 2-3 business days to get the results of your labs back.   The new Shingrix vaccine (for shingles) is a 2 shot series. It can make people feel low energy, achy and almost like they have the flu for 48 hours after injection. Please plan accordingly when deciding on when to get this shot. Call our office for a nurse visit appointment to get this. The second shot of the series is less severe regarding the side effects, but it still lasts 48 hours.   Get a flu shot.   Keep the diet clean.  OK to use Debrox (peroxide) in the ear to loosen up wax. Also recommend using a bulb syringe (for removing boogers from baby's noses) to flush through warm water. Do not use Q-tips as this can impact wax further.  Let me know if you would like to start anything for smoking cessation. Chantix or patches would be what I recommend.  If you do not hear anything about your referral in the next 1-2 weeks, call our office and ask for an update.  Let us know if you need anything.

## 2017-12-28 ENCOUNTER — Other Ambulatory Visit: Payer: Self-pay | Admitting: Family Medicine

## 2017-12-28 DIAGNOSIS — N5089 Other specified disorders of the male genital organs: Secondary | ICD-10-CM

## 2017-12-29 ENCOUNTER — Telehealth: Payer: Self-pay | Admitting: *Deleted

## 2017-12-29 NOTE — Telephone Encounter (Signed)
FYI: New patient 12/27/17, did not get labs drawn after appointment; [facility] has scheduled him for 01/05/18 at 8:25am for lab. Patient's wife was on the phone wanting to get pt's Flu vaccine given that same day, scheduled pt for 3:15 pm d/t no Am availability slot but informed [facility] to let patient know to ask for me after his labs [I will try to getin to lab while he is still there] and I will give him his Influenza immunization. Also, to inform patient nothing to eat or drink, with exception of water that morning, as he has a fasting lab ordered [Lipid]; caller understood & agreed/SLS 10/30

## 2018-01-05 ENCOUNTER — Ambulatory Visit (HOSPITAL_BASED_OUTPATIENT_CLINIC_OR_DEPARTMENT_OTHER)
Admission: RE | Admit: 2018-01-05 | Discharge: 2018-01-05 | Disposition: A | Discharge status: Home / Self Care | Payer: BLUE CROSS/BLUE SHIELD | Source: Ambulatory Visit | Attending: Family Medicine | Admitting: Family Medicine

## 2018-01-05 ENCOUNTER — Other Ambulatory Visit (INDEPENDENT_AMBULATORY_CARE_PROVIDER_SITE_OTHER): Payer: BLUE CROSS/BLUE SHIELD

## 2018-01-05 ENCOUNTER — Ambulatory Visit (INDEPENDENT_AMBULATORY_CARE_PROVIDER_SITE_OTHER): Payer: BLUE CROSS/BLUE SHIELD

## 2018-01-05 DIAGNOSIS — I700 Atherosclerosis of aorta: Secondary | ICD-10-CM | POA: Diagnosis not present

## 2018-01-05 DIAGNOSIS — Z125 Encounter for screening for malignant neoplasm of prostate: Secondary | ICD-10-CM | POA: Diagnosis not present

## 2018-01-05 DIAGNOSIS — N5089 Other specified disorders of the male genital organs: Secondary | ICD-10-CM

## 2018-01-05 DIAGNOSIS — R9389 Abnormal findings on diagnostic imaging of other specified body structures: Secondary | ICD-10-CM | POA: Insufficient documentation

## 2018-01-05 DIAGNOSIS — Z23 Encounter for immunization: Secondary | ICD-10-CM

## 2018-01-05 DIAGNOSIS — N509 Disorder of male genital organs, unspecified: Secondary | ICD-10-CM | POA: Insufficient documentation

## 2018-01-05 DIAGNOSIS — Z122 Encounter for screening for malignant neoplasm of respiratory organs: Secondary | ICD-10-CM | POA: Diagnosis not present

## 2018-01-05 DIAGNOSIS — F17210 Nicotine dependence, cigarettes, uncomplicated: Secondary | ICD-10-CM

## 2018-01-05 DIAGNOSIS — J432 Centrilobular emphysema: Secondary | ICD-10-CM | POA: Diagnosis not present

## 2018-01-05 LAB — COMPREHENSIVE METABOLIC PANEL
ALK PHOS: 42 U/L (ref 39–117)
ALT: 15 U/L (ref 0–53)
AST: 25 U/L (ref 0–37)
Albumin: 4.4 g/dL (ref 3.5–5.2)
BILIRUBIN TOTAL: 0.9 mg/dL (ref 0.2–1.2)
BUN: 7 mg/dL (ref 6–23)
CO2: 27 meq/L (ref 19–32)
Calcium: 9.1 mg/dL (ref 8.4–10.5)
Chloride: 101 mEq/L (ref 96–112)
Creatinine, Ser: 0.75 mg/dL (ref 0.40–1.50)
GFR: 114.86 mL/min (ref >60.00)
Glucose, Bld: 111 mg/dL — ABNORMAL HIGH (ref 70–99)
POTASSIUM: 4.5 meq/L (ref 3.5–5.1)
SODIUM: 136 meq/L (ref 135–145)
TOTAL PROTEIN: 7.2 g/dL (ref 6.0–8.3)

## 2018-01-05 LAB — LIPID PANEL
Cholesterol: 171 mg/dL (ref 0–200)
HDL: 71.5 mg/dL (ref >39.00)
LDL Cholesterol: 89 mg/dL (ref 0–99)
NONHDL: 99.45
Total CHOL/HDL Ratio: 2
Triglycerides: 51 mg/dL (ref 0.0–149.0)
VLDL: 10.2 mg/dL (ref 0.0–40.0)

## 2018-01-05 LAB — PSA: PSA: 0.69 ng/mL (ref 0.10–4.00)

## 2018-01-05 NOTE — Progress Notes (Signed)
Flu

## 2018-01-06 ENCOUNTER — Other Ambulatory Visit: Payer: Self-pay | Admitting: Family Medicine

## 2018-01-06 DIAGNOSIS — R739 Hyperglycemia, unspecified: Secondary | ICD-10-CM

## 2018-01-14 ENCOUNTER — Other Ambulatory Visit (INDEPENDENT_AMBULATORY_CARE_PROVIDER_SITE_OTHER): Payer: BLUE CROSS/BLUE SHIELD

## 2018-01-14 ENCOUNTER — Ambulatory Visit (INDEPENDENT_AMBULATORY_CARE_PROVIDER_SITE_OTHER): Payer: BLUE CROSS/BLUE SHIELD

## 2018-01-14 DIAGNOSIS — Z23 Encounter for immunization: Secondary | ICD-10-CM

## 2018-01-14 DIAGNOSIS — R739 Hyperglycemia, unspecified: Secondary | ICD-10-CM | POA: Diagnosis not present

## 2018-01-14 NOTE — Addendum Note (Signed)
Addended by: Kelle Darting A on: 01/14/2018 03:24 PM   Modules accepted: Orders

## 2018-01-15 LAB — HEMOGLOBIN A1C
Hgb A1c MFr Bld: 5 % of total Hgb (ref <5.7)
Mean Plasma Glucose: 97 (calc)
eAG (mmol/L): 5.4 (calc)

## 2018-02-03 ENCOUNTER — Encounter: Payer: Self-pay | Admitting: Gastroenterology

## 2018-02-10 ENCOUNTER — Ambulatory Visit: Payer: BLUE CROSS/BLUE SHIELD

## 2018-02-25 ENCOUNTER — Ambulatory Visit (AMBULATORY_SURGERY_CENTER): Payer: Self-pay | Admitting: *Deleted

## 2018-02-25 ENCOUNTER — Encounter: Payer: Self-pay | Admitting: Gastroenterology

## 2018-02-25 VITALS — Ht 66.0 in | Wt 132.0 lb

## 2018-02-25 DIAGNOSIS — Z1211 Encounter for screening for malignant neoplasm of colon: Secondary | ICD-10-CM

## 2018-02-25 MED ORDER — NA SULFATE-K SULFATE-MG SULF 17.5-3.13-1.6 GM/177ML PO SOLN: ORAL | 0 refills | Status: DC

## 2018-02-25 NOTE — Progress Notes (Signed)
Patient denies any allergies to eggs or soy. Patient denies any problems with anesthesia/sedation. Patient denies any oxygen use at home. Patient denies taking any diet/weight loss medications or blood thinners. EMMI education offered, pt declined.  Suprep $15 off coupon given to pt.

## 2018-03-11 ENCOUNTER — Encounter: Payer: BLUE CROSS/BLUE SHIELD | Admitting: Gastroenterology

## 2018-03-17 ENCOUNTER — Encounter: Payer: Self-pay | Admitting: Gastroenterology

## 2018-03-17 ENCOUNTER — Ambulatory Visit (AMBULATORY_SURGERY_CENTER): Payer: BLUE CROSS/BLUE SHIELD | Admitting: Gastroenterology

## 2018-03-17 VITALS — BP 121/80 | HR 67 | Temp 98.6°F | Resp 14 | Ht 66.0 in | Wt 125.0 lb

## 2018-03-17 DIAGNOSIS — D123 Benign neoplasm of transverse colon: Secondary | ICD-10-CM | POA: Diagnosis not present

## 2018-03-17 DIAGNOSIS — Z1211 Encounter for screening for malignant neoplasm of colon: Secondary | ICD-10-CM | POA: Diagnosis not present

## 2018-03-17 DIAGNOSIS — D125 Benign neoplasm of sigmoid colon: Secondary | ICD-10-CM

## 2018-03-17 DIAGNOSIS — K5730 Diverticulosis of large intestine without perforation or abscess without bleeding: Secondary | ICD-10-CM

## 2018-03-17 MED ORDER — SODIUM CHLORIDE 0.9 % IV SOLN: 500.0000 mL | Freq: Once | INTRAVENOUS | Status: DC

## 2018-03-17 NOTE — Progress Notes (Signed)
PT taken to PACU. Monitors in place. VSS. Report given to RN. 

## 2018-03-17 NOTE — Patient Instructions (Signed)
Information on polyps given.   YOU HAD AN ENDOSCOPIC PROCEDURE TODAY AT THE [facility]:   Refer to the procedure report that was given to you for any specific questions about what was found during the examination.  If the procedure report does not answer your questions, please call your gastroenterologist to clarify.  If you requested that your care partner not be given the details of your procedure findings, then the procedure report has been included in a sealed envelope for you to review at your convenience later.  YOU SHOULD EXPECT: Some feelings of bloating in the abdomen. Passage of more gas than usual.  Walking can help get rid of the air that was put into your GI tract during the procedure and reduce the bloating. If you had a lower endoscopy (such as a colonoscopy or flexible sigmoidoscopy) you may notice spotting of blood in your stool or on the toilet paper. If you underwent a bowel prep for your procedure, you may not have a normal bowel movement for a few days.  Please Note:  You might notice some irritation and congestion in your nose or some drainage.  This is from the oxygen used during your procedure.  There is no need for concern and it should clear up in a day or so.  SYMPTOMS TO REPORT IMMEDIATELY:   Following lower endoscopy (colonoscopy or flexible sigmoidoscopy):  Excessive amounts of blood in the stool  Significant tenderness or worsening of abdominal pains  Swelling of the abdomen that is new, acute  Fever of 100F or higher     For urgent or emergent issues, a gastroenterologist can be reached at any hour by calling [phone].   DIET:  We do recommend a small meal at first, but then you may proceed to your regular diet.  Drink plenty of fluids but you should avoid alcoholic beverages for 24 hours.  ACTIVITY:  You should plan to take it easy for the rest of today and you should NOT DRIVE or use heavy machinery until tomorrow (because of the  sedation medicines used during the test).    FOLLOW UP: Our staff will call the number listed on your records the next business day following your procedure to check on you and address any questions or concerns that you may have regarding the information given to you following your procedure. If we do not reach you, we will leave a message.  However, if you are feeling well and you are not experiencing any problems, there is no need to return our call.  We will assume that you have returned to your regular daily activities without incident.  If any biopsies were taken you will be contacted by phone or by letter within the next 1-3 weeks.  Please call us at [phone] if you have not heard about the biopsies in 3 weeks.    SIGNATURES/CONFIDENTIALITY: You and/or your care partner have signed paperwork which will be entered into your electronic medical record.  These signatures attest to the fact that that the information above on your After Visit Summary has been reviewed and is understood.  Full responsibility of the confidentiality of this discharge information lies with you and/or your care-partner. 

## 2018-03-17 NOTE — Progress Notes (Signed)
Called to room to assist during endoscopic procedure.  Patient ID and intended procedure confirmed with present staff. Received instructions for my participation in the procedure from the performing physician.  

## 2018-03-17 NOTE — Op Note (Signed)
[facility] Patient Name: Charles Hudson Procedure Date: 03/17/2018 1:23 PM MRN: [id number] Endoscopist: Gerrit Heck , MD Age: 58 Referring MD:  Date of Birth: 1960-03-19 Gender: Male Account #: [id number] Procedure:                Colonoscopy Indications:              Screening for colorectal malignant neoplasm, This                            is the patient's first colonoscopy Medicines:                Monitored Anesthesia Care Procedure:                Pre-Anesthesia Assessment:                           - Prior to the procedure, a History and Physical                            was performed, and patient medications and                            allergies were reviewed. The patient's tolerance of                            previous anesthesia was also reviewed. The risks                            and benefits of the procedure and the sedation                            options and risks were discussed with the patient.                            All questions were answered, and informed consent                            was obtained. Prior Anticoagulants: The patient has                            taken no previous anticoagulant or antiplatelet                            agents. ASA Grade Assessment: II - A patient with                            mild systemic disease. After reviewing the risks                            and benefits, the patient was deemed in                            satisfactory condition to undergo the procedure.  After obtaining informed consent, the colonoscope                            was passed under direct vision. Throughout the                            procedure, the patient's blood pressure, pulse, and                            oxygen saturations were monitored continuously. The                            Colonoscope was introduced through the anus and                            advanced to the the terminal  ileum. The colonoscopy                            was performed without difficulty. The patient                            tolerated the procedure well. The quality of the                            bowel preparation was adequate. Scope In: 1:37:13 PM Scope Out: 1:51:12 PM Scope Withdrawal Time: 0 hours 10 minutes 35 seconds  Total Procedure Duration: 0 hours 13 minutes 59 seconds  Findings:                 The perianal and digital rectal examinations were                            normal.                           Two sessile polyps were found in the sigmoid colon                            and transverse colon. The polyps were 2 to 4 mm in                            size. These polyps were removed with a cold snare.                            Resection and retrieval were complete. Estimated                            blood loss was minimal.                           A few small-mouthed diverticula were found in the                            sigmoid colon.  The exam was otherwise normal throughout the                            remainder of the colon.                           The retroflexed view of the distal rectum and anal                            verge was normal and showed no anal or rectal                            abnormalities.                           The terminal ileum appeared normal. Complications:            No immediate complications. Estimated Blood Loss:     Estimated blood loss was minimal. Impression:               - Two 2 to 4 mm polyps in the sigmoid colon and in                            the transverse colon, removed with a cold snare.                            Resected and retrieved.                           - Diverticulosis in the sigmoid colon.                           - The distal rectum and anal verge are normal on                            retroflexion view.                           - The examined portion of the ileum  was normal. Recommendation:           - Patient has a contact number available for                            emergencies. The signs and symptoms of potential                            delayed complications were discussed with the                            patient. Return to normal activities tomorrow.                            Written discharge instructions were provided to the  patient.                           - Resume previous diet today.                           - Continue present medications.                           - Await pathology results.                           - Repeat colonoscopy in 5-10 years for surveillance                            based on pathology results.                           - Return to GI office PRN. Gerrit Heck, MD 03/17/2018 1:56:03 PM

## 2018-03-18 ENCOUNTER — Telehealth: Payer: Self-pay

## 2018-03-18 NOTE — Telephone Encounter (Signed)
  Follow up Call-  Call back number 03/17/2018  Post procedure Call Back phone  # [phone]  Permission to leave phone message Yes     Patient questions:  Do you have a fever, pain , or abdominal swelling? No. Pain Score  0 *  Have you tolerated food without any problems? Yes.    Have you been able to return to your normal activities? Yes.    Do you have any questions about your discharge instructions: Diet   No. Medications  No. Follow up visit  No.  Do you have questions or concerns about your Care? No.  Actions: * If pain score is 4 or above: No action needed, pain <4.

## 2018-03-24 ENCOUNTER — Encounter: Payer: Self-pay | Admitting: Gastroenterology

## 2018-04-28 ENCOUNTER — Ambulatory Visit (INDEPENDENT_AMBULATORY_CARE_PROVIDER_SITE_OTHER): Payer: BLUE CROSS/BLUE SHIELD | Admitting: *Deleted

## 2018-04-28 DIAGNOSIS — Z23 Encounter for immunization: Secondary | ICD-10-CM

## 2018-04-28 NOTE — Progress Notes (Signed)
Patient her for 2nd shingles vaccine.    Immunization given and patient tolerated well.

## 2018-10-14 ENCOUNTER — Other Ambulatory Visit: Payer: Self-pay

## 2018-10-14 DIAGNOSIS — Z20822 Contact with and (suspected) exposure to covid-19: Secondary | ICD-10-CM

## 2018-10-16 LAB — NOVEL CORONAVIRUS, NAA: SARS-CoV-2, NAA: NOT DETECTED

## 2018-12-13 IMAGING — US US SCROTUM
1 series · 13 of 25 positions shown · non-contrast
Comparison: None

CLINICAL DATA: LEFT testicular mass on physical exam

EXAM:
SCROTAL ULTRASOUND
DOPPLER ULTRASOUND OF THE TESTICLES
TECHNIQUE: Complete ultrasound examination of the testicles, epididymis, and
other scrotal structures was performed. Color and spectral Doppler
ultrasound were also utilized to evaluate blood flow to the
testicles.

[Series 1: us scrotum · 0.10mm/px · 13 of 85 slices shown]
[im 1/85]
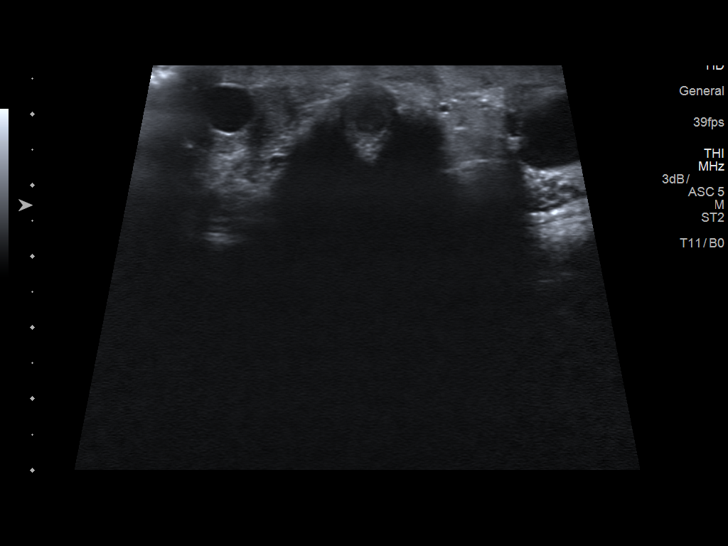
[im 8/85]
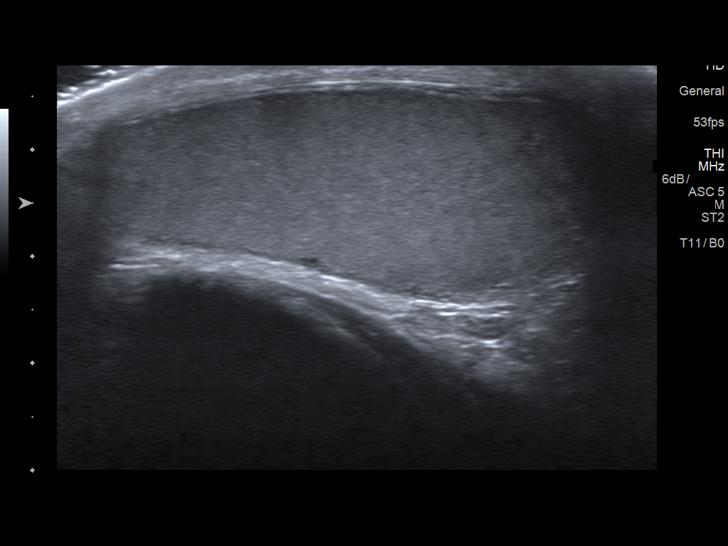
[im 15/85]
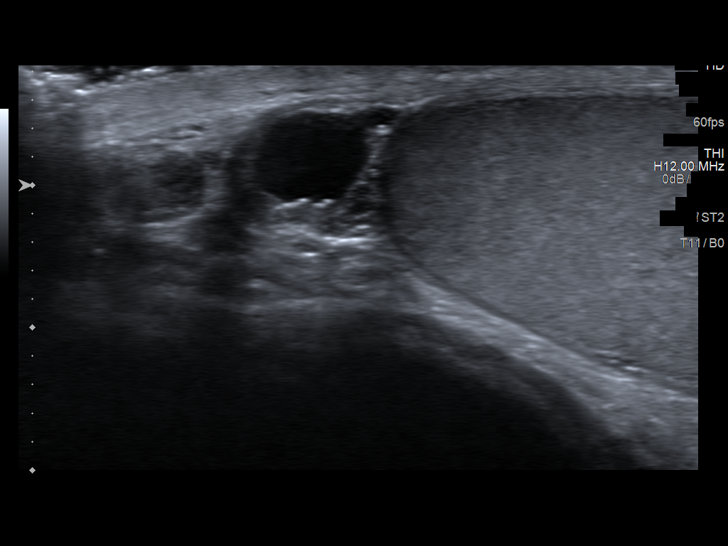
[im 22/85]
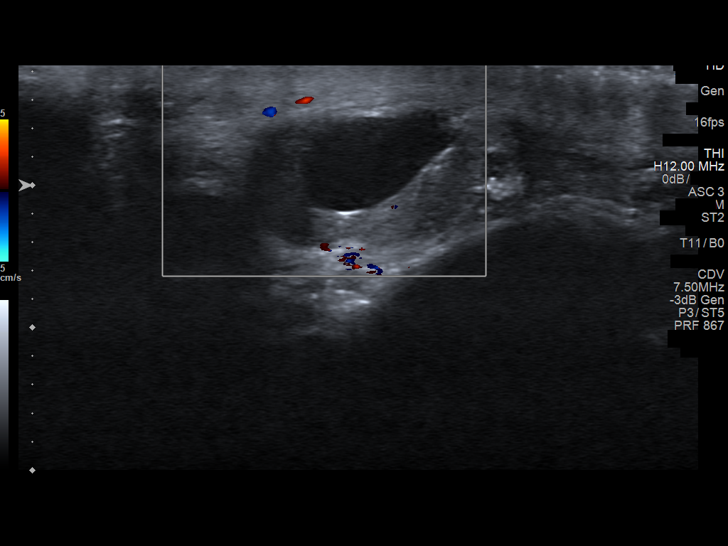
[im 29/85]
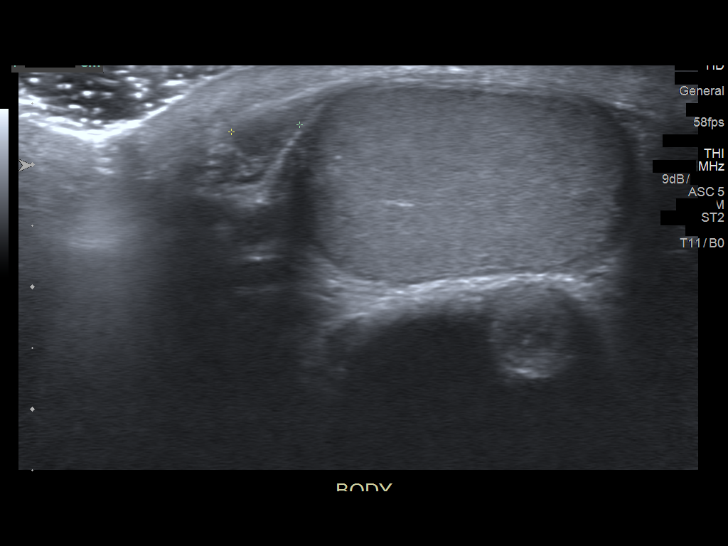
[im 36/85]
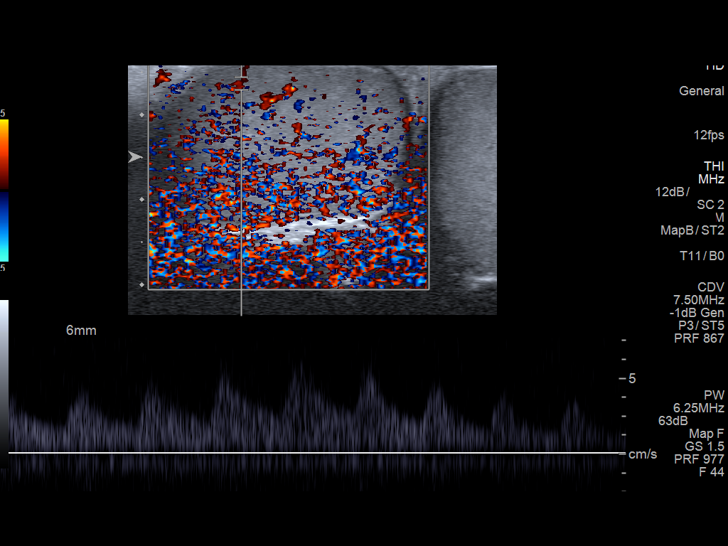
[im 43/85]
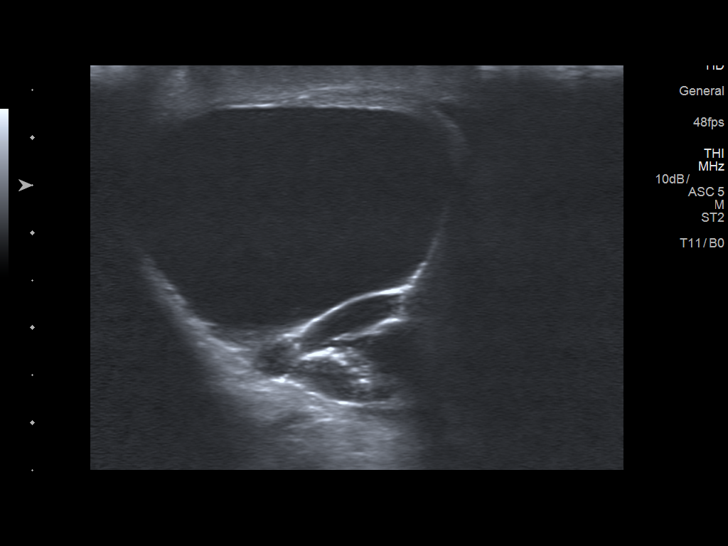
[im 50/85]
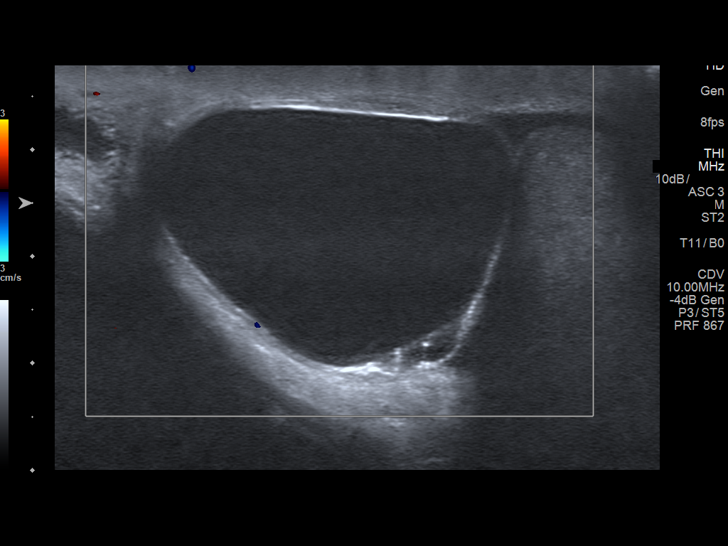
[im 57/85]
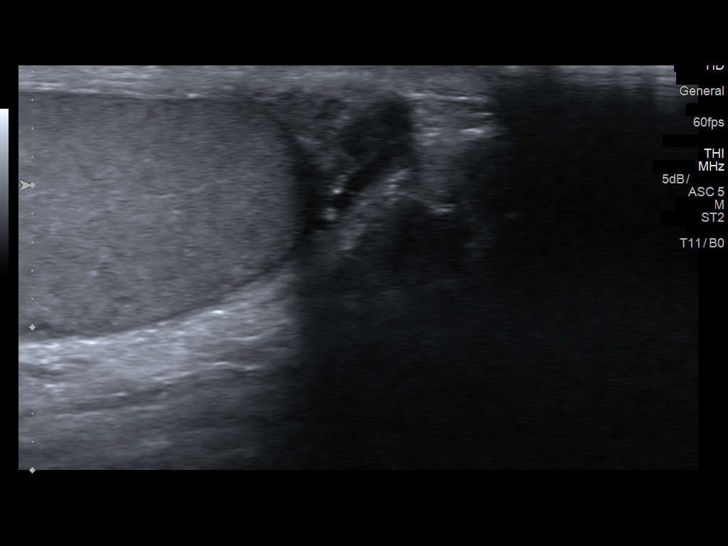
[im 64/85]
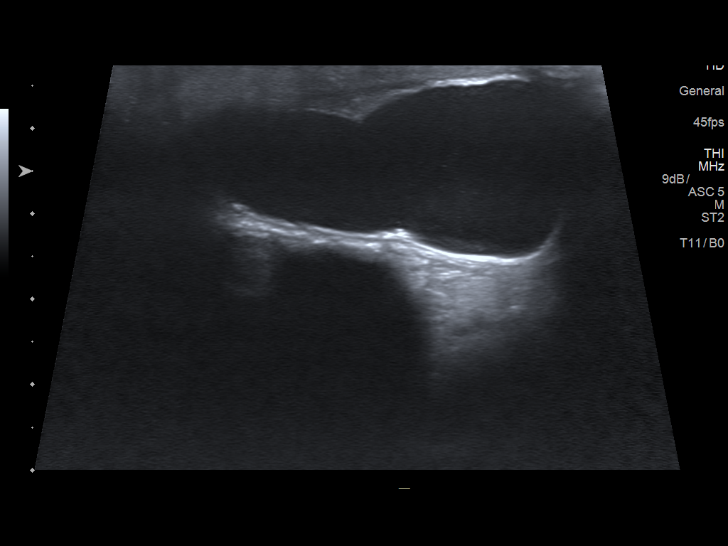
[im 71/85]
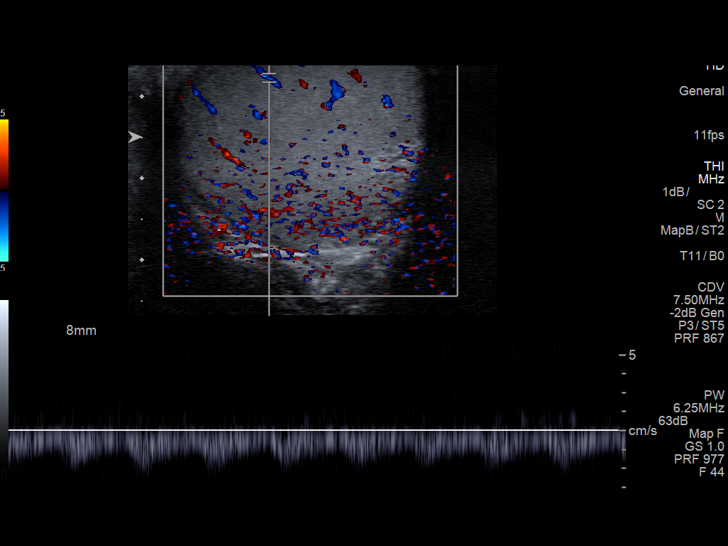
[im 78/85]
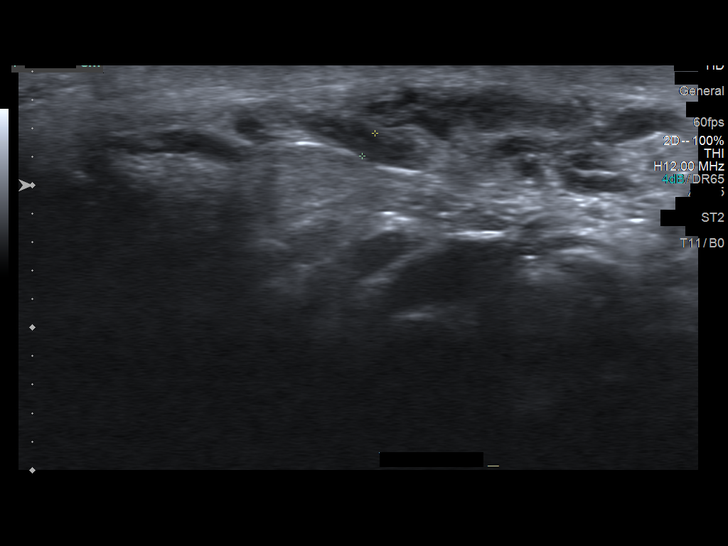
[im 85/85]
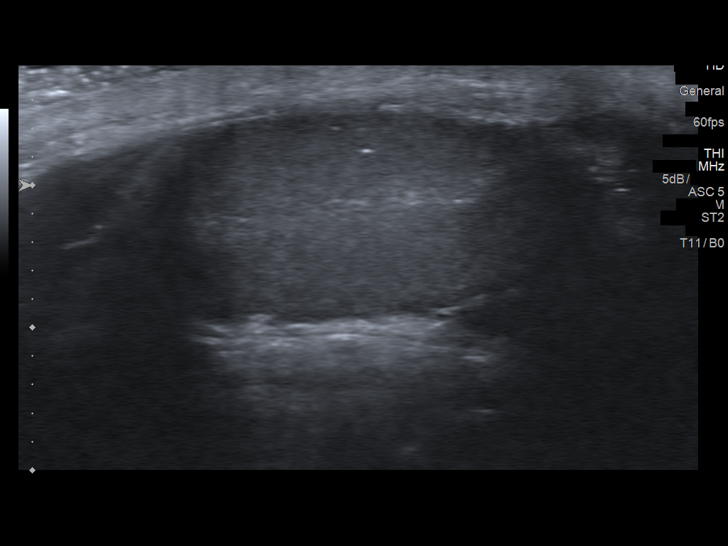

[13 of 25 positions shown; findings below may reference images not displayed]

FINDINGS: Right testicle

Measurements: 4.8 x 1.9 x 2.8 cm. Normal echogenicity without mass.
Few tiny microcalcifications, nonspecific. Internal blood flow
present on color Doppler imaging.

Left testicle

Measurements: 4.4 x 2.0 x 3.2 cm. Normal morphology without mass.
Few tiny microcalcifications, nonspecific internal blood flow
present on color Doppler imaging.

Right epididymis: Small cyst at head of RIGHT epididymis 7 x 7 x 11
mm.

Left epididymis: Large multiloculated versus multiple adjacent cysts
at head of LEFT epididymis, largest cystic areas measuring 2.3 x
x 2.4 cm and 3.6 x 2.4 x 3.4 cm. Scattered low level internal
echogenicity within these cystic foci, question large epididymal
cyst versus spermatoceles

Hydrocele:  Absent bilaterally

Varicocele:  None definitely visualized.

Pulsed Doppler interrogation of both testes demonstrates normal low
resistance arterial and venous waveforms bilaterally.
IMPRESSION: Large multiloculated cyst versus multiple adjacent cysts in the head
of the LEFT epididymis, with the largest cystic foci measuring
cm and 3.6 cm in greatest sizes, question spermatoceles or less
likely epididymal cysts.

No significant testicular abnormalities.

Remainder of exam normal.

## 2019-06-08 IMAGING — CT CT CHEST LUNG CANCER SCREENING LOW DOSE W/O CM
2 of 5 series · 15 of 40 positions shown, 18 images · non-contrast
Comparison: None.

CLINICAL DATA: 57-year-old asymptomatic male current smoker with 45
pack-year smoking history.

EXAM:
CT CHEST WITHOUT CONTRAST LOW-DOSE FOR LUNG CANCER SCREENING
TECHNIQUE: Multidetector CT imaging of the chest was performed following the
standard protocol without IV contrast.

[Series 3: lungs · axial · 0.67mm/px · z∈[-341,-40]mm · 12 of 333 slices shown, 15 images]
[im 16/333  mediastinal]
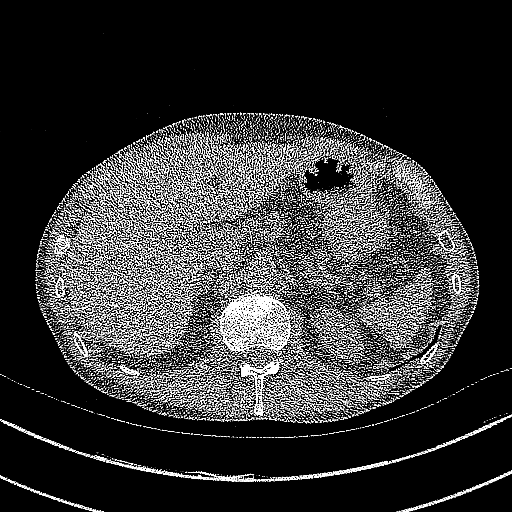
[im 16/333  lung]
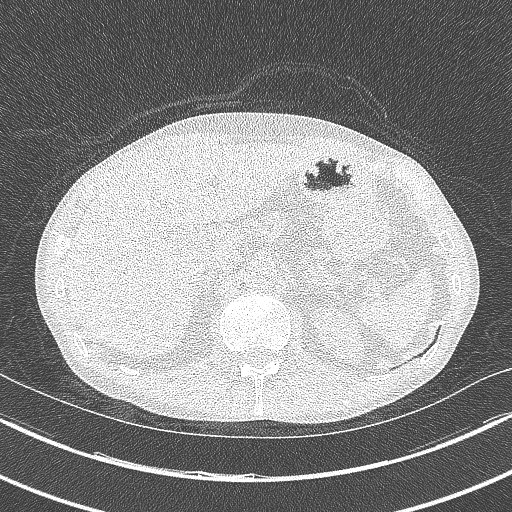
[im 46/333  lung]
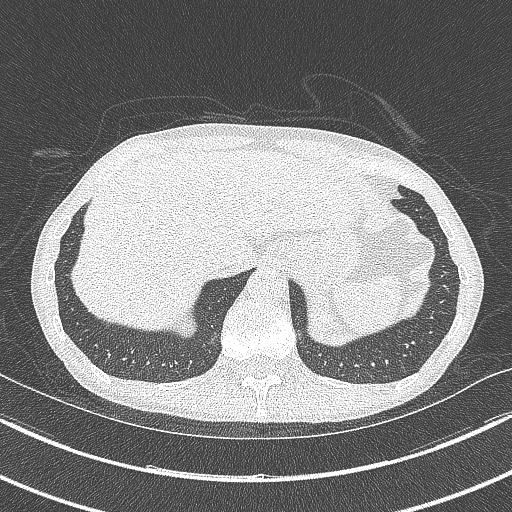
[im 76/333  lung]
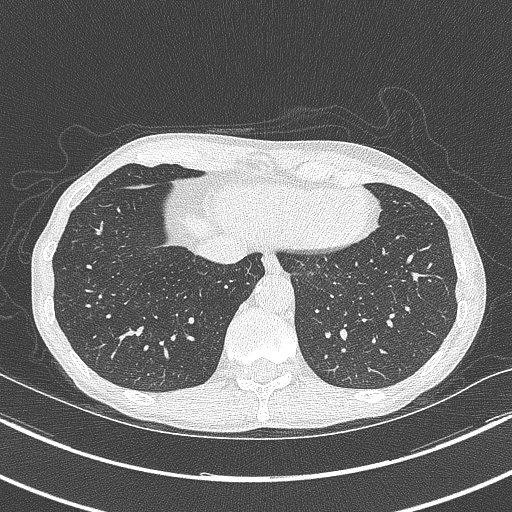
[im 106/333  lung]
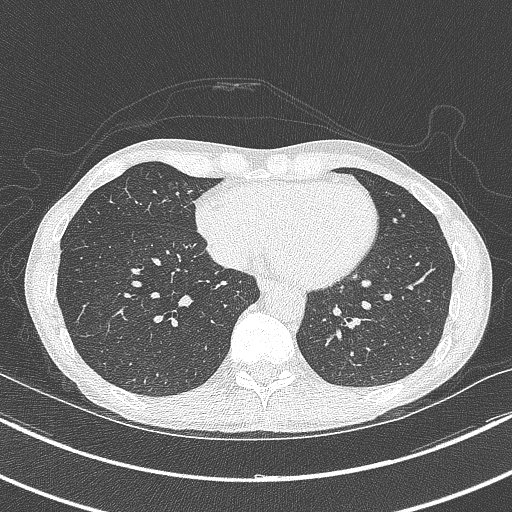
[im 121/333  mediastinal]
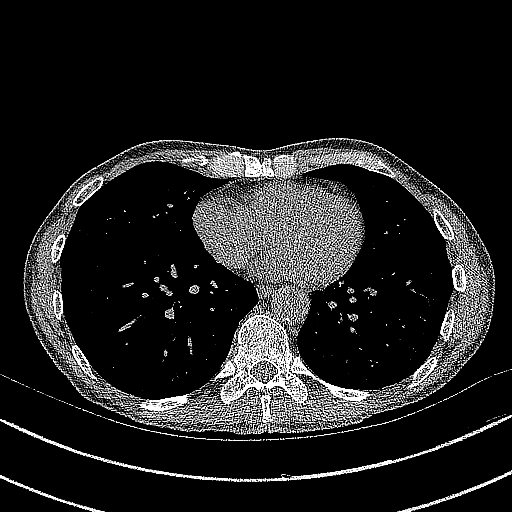
[im 121/333  lung]
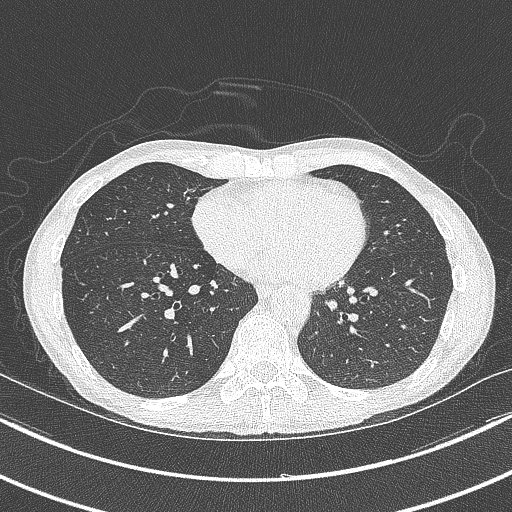
[im 151/333  lung]
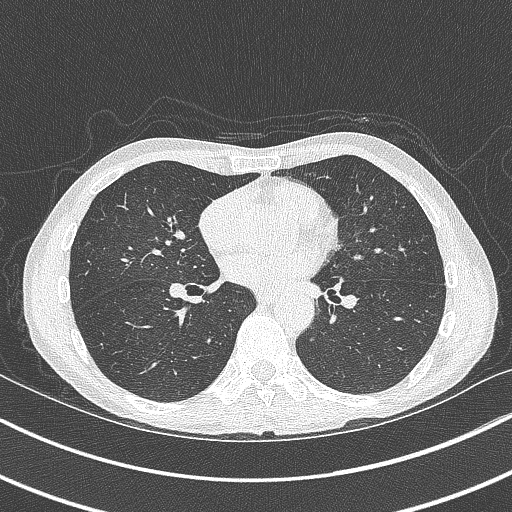
[im 182/333  lung]
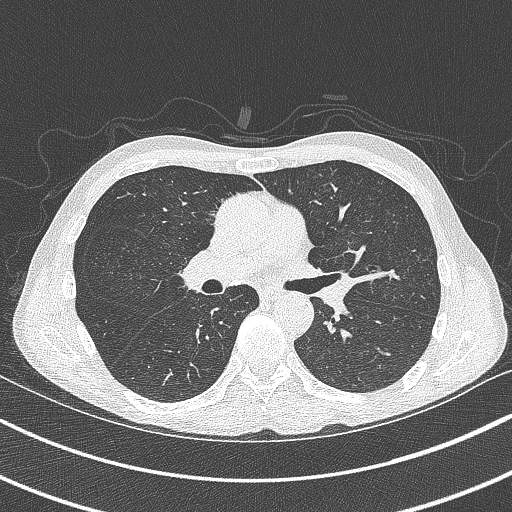
[im 212/333  lung]
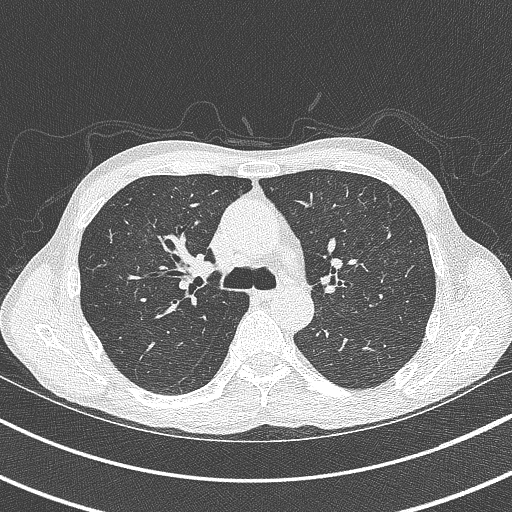
[im 227/333  mediastinal]
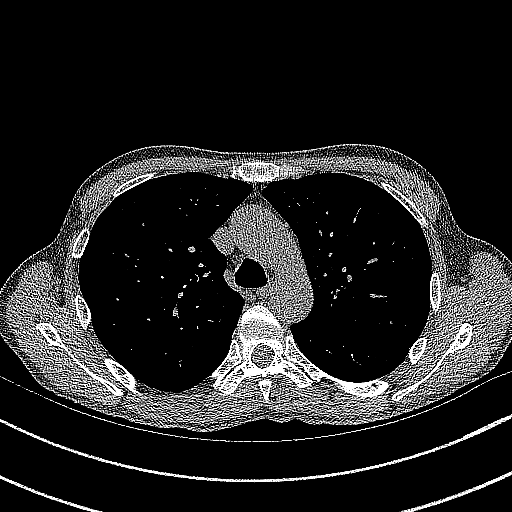
[im 227/333  lung]
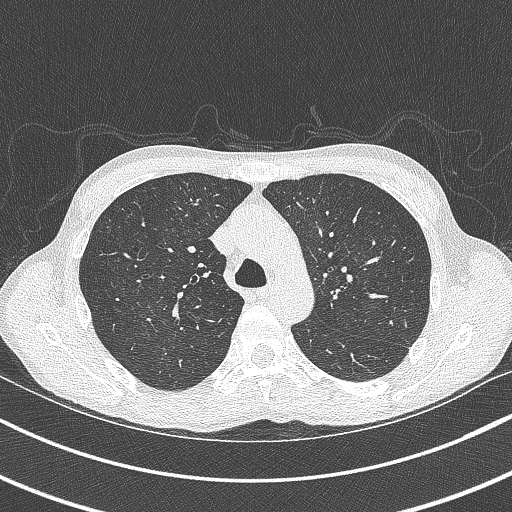
[im 257/333  lung]
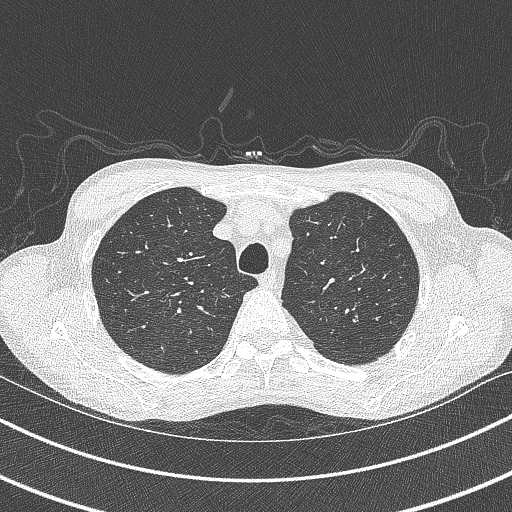
[im 287/333  lung]
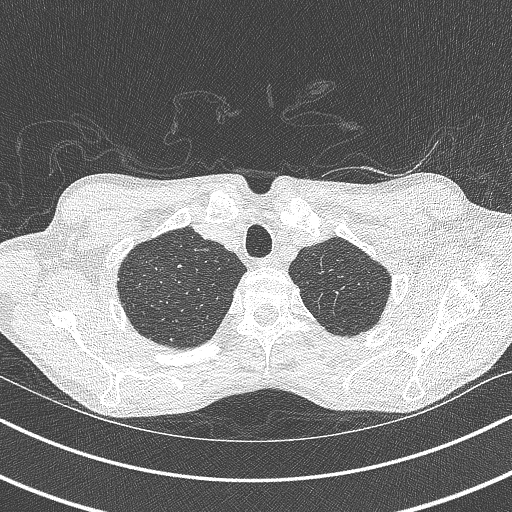
[im 317/333  lung]
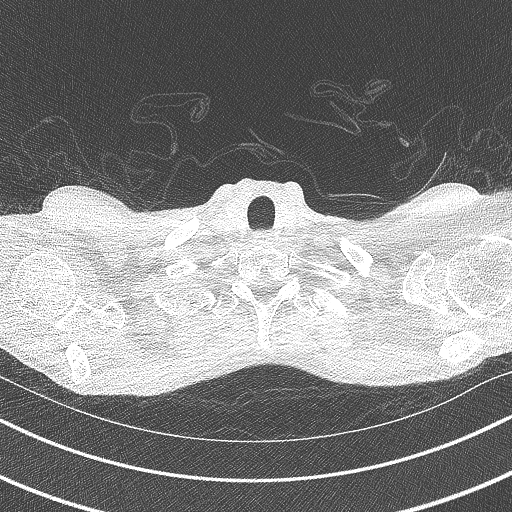

[Series 4: coronal · coronal · 0.64mm/px · 3 of 196 slices shown]
[im 40/196  lung]
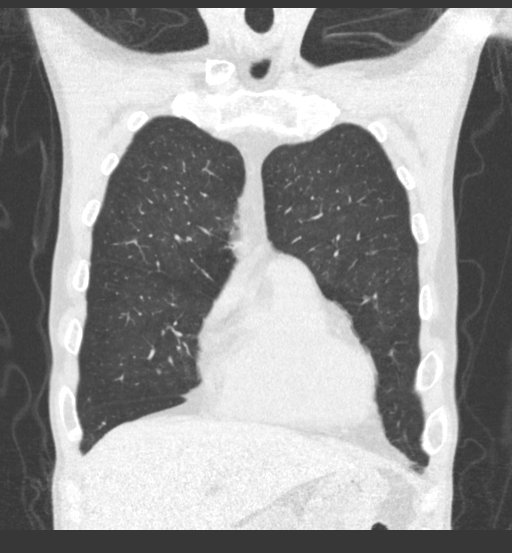
[im 79/196  lung]
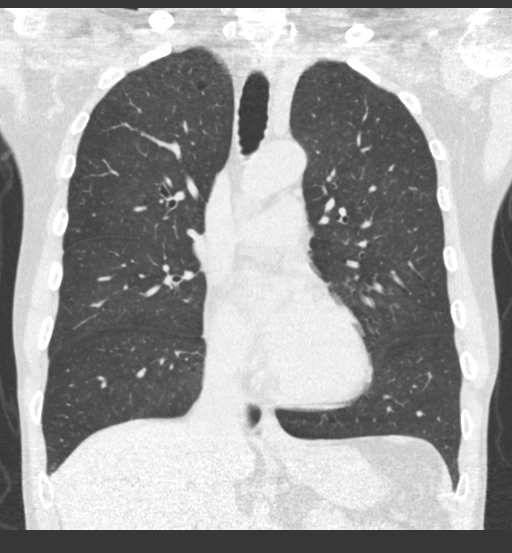
[im 118/196  lung]
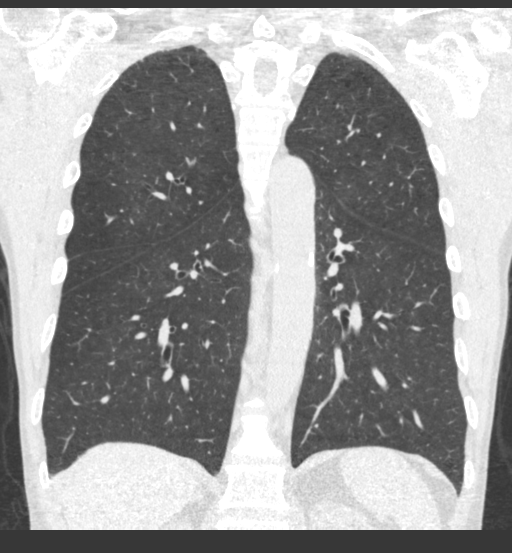

[15 of 40 positions shown; findings below may reference images not displayed]

FINDINGS: Cardiovascular: Normal heart size. No significant pericardial
effusion/thickening. Atherosclerotic nonaneurysmal thoracic aorta.
Normal caliber pulmonary arteries.

Mediastinum/Nodes: No discrete thyroid nodules. Unremarkable
esophagus. No pathologically enlarged axillary, mediastinal or hilar
lymph nodes, noting limited sensitivity for the detection of hilar
adenopathy on this noncontrast study. Coarsely calcified subcarinal
and right hilar nodes compatible with prior granulomatous disease.

Lungs/Pleura: No pneumothorax. No pleural effusion. Mild
centrilobular emphysema with mild diffuse bronchial wall thickening.
No acute consolidative airspace disease or lung masses. A few
scattered small solid pulmonary nodules, largest 3.6 mm in volume
derived mean diameter in the right middle lobe (series 3/image 186).
Small calcified right lung base granuloma.

Upper abdomen: No acute abnormality.

Musculoskeletal:  No aggressive appearing focal osseous lesions.
IMPRESSION: Lung-RADS 2, benign appearance or behavior. Continue annual
screening with low-dose chest CT without contrast in 12 months.

Aortic Atherosclerosis (D097F-5V9.9) and Emphysema ([id number].M).

## 2019-06-27 ENCOUNTER — Encounter: Payer: Self-pay | Admitting: Family Medicine

## 2019-06-27 ENCOUNTER — Ambulatory Visit (INDEPENDENT_AMBULATORY_CARE_PROVIDER_SITE_OTHER): Payer: BC Managed Care – PPO | Admitting: Family Medicine

## 2019-06-27 ENCOUNTER — Other Ambulatory Visit: Payer: Self-pay

## 2019-06-27 VITALS — BP 138/86 | HR 84 | Temp 95.6°F | Ht 66.0 in | Wt 149.2 lb

## 2019-06-27 DIAGNOSIS — M546 Pain in thoracic spine: Secondary | ICD-10-CM | POA: Diagnosis not present

## 2019-06-27 NOTE — Patient Instructions (Signed)
Heat (pad or rice pillow in microwave) over affected area, 10-15 minutes twice daily.   Ice/cold pack over area for 10-15 min twice daily.  OK to take Tylenol 1000 mg (2 extra strength tabs) or 975 mg (3 regular strength tabs) every 6 hours as needed.  Mid-Back Strain Rehab It is normal to feel mild stretching, pulling, tightness, or discomfort as you do these exercises, but you should stop right away if you feel sudden pain or your pain gets worse.   Stretching and range of motion exercises This exercise warms up your muscles and joints and improves the movement and flexibility of your back and shoulders. This exercise also help to relieve pain. Exercise A: Chest and spine stretch    1. Lie down on your back on a firm surface. 2. Roll a towel or a small blanket so it is about 4 inches (10 cm) in diameter. 3. Put the towel lengthwise under the middle of your back so it is under your spine, but not under your shoulder blades. 4. To increase the stretch, you may put your hands behind your head and let your elbows fall to your sides. 5. Hold for 30 seconds. Repeat exercise 2 times. Complete this exercise 3 times per week.  Strengthening exercises These exercises build strength and endurance in your back and your shoulder blade muscles. Endurance is the ability to use your muscles for a long time, even after they get tired. Exercise B: Alternating arm and leg raises    1. Get on your hands and knees on a firm surface. If you are on a hard floor, you may want to use padding to cushion your knees, such as an exercise mat. 2. Line up your arms and legs. Your hands should be below your shoulders, and your knees should be below your hips. 3. Lift your left leg behind you. At the same time, raise your right arm and straighten it in front of you. ? Do not lift your leg higher than your hip. ? Do not lift your arm higher than your shoulder. ? Keep your abdominal and back muscles tight. ? Keep  your hips facing the ground. ? Do not arch your back. ? Keep your balance carefully, and do not hold your breath. 4. Hold for 3 seconds. 5. Slowly return to the starting position and repeat with your right leg and your left arm. Repeat 2 times. Complete this exercise 3 times per week. Exercise C: Straight arm rows (shoulder extension)     1. Stand with your feet shoulder width apart. 2. Secure an exercise band to a stable object in front of you so the band is at or above shoulder height. 3. Hold one end of the exercise band in each hand. 4. Straighten your elbows and lift your hands up to shoulder height. 5. Step back, away from the secured end of the exercise band, until the band stretches. 6. Squeeze your shoulder blades together and pull your hands down to the sides of your thighs. Stop when your hands are straight down by your sides. Do not let your hands go behind your body. 7. Hold for 3 seconds. 8. Slowly return to the starting position. Repeat 2 times. Complete this exercise 3 times per week. Exercise D: Shoulder external rotation, prone 1. Lie on your abdomen on a firm bed so your left / right forearm hangs over the edge of the bed and your upper arm is on the bed, straight out from your body. ?  Your elbow should be bent. ? Your palm should be facing your feet. 2. If instructed, hold a 5 lb weight in your hand. 3. Squeeze your shoulder blade toward the middle of your back. Do not let your shoulder lift toward your ear. 4. Keep your elbow bent in an "L" shape (90 degrees) while you slowly move your forearm up toward the ceiling. Move your forearm up to the height of the bed, toward your head. ? Your upper arm should not move. ? At the top of the movement, your palm should face the floor. 5. Hold for 3 seconds. 6. Slowly return to the starting position and relax your muscles. Repeat 3 times. Complete this exercise 3 times per week. Exercise E: Scapular retraction and external  rotation, rowing    1. Sit in a stable chair without armrests, or stand. 2. Secure an exercise band to a stable object in front of you so it is at shoulder height. 3. Hold one end of the exercise band in each hand. 4. Bring your arms out straight in front of you. 5. Step back, away from the secured end of the exercise band, until the band stretches. 6. Pull the band backward. As you do this, bend your elbows and squeeze your shoulder blades together, but avoid letting the rest of your body move. Do not let your shoulders lift up toward your ears. 7. Stop when your elbows are at your sides or slightly behind your body. 8. Hold for 5 seconds. 9. Slowly straighten your arms to return to the starting position. Repeat 2 times. Complete this exercise 3 times per week. Posture and body mechanics    Body mechanics refers to the movements and positions of your body while you do your daily activities. Posture is part of body mechanics. Good posture and healthy body mechanics can help to relieve stress in your body's tissues and joints. Good posture means that your spine is in its natural S-curve position (your spine is neutral), your shoulders are pulled back slightly, and your head is not tipped forward. The following are general guidelines for applying improved posture and body mechanics to your everyday activities. Standing     When standing, keep your spine neutral and your feet about hip-width apart. Keep a slight bend in your knees. Your ears, shoulders, and hips should line up.  When you do a task in which you lean forward while standing in one place for a long time, place one foot up on a stable object that is 2-4 inches (5-10 cm) high, such as a footstool. This helps keep your spine neutral. Sitting     When sitting, keep your spine neutral and keep your feet flat on the floor. Use a footrest, if necessary, and keep your thighs parallel to the floor. Avoid rounding your shoulders, and  avoid tilting your head forward.  When working at a desk or a computer, keep your desk at a height where your hands are slightly lower than your elbows. Slide your chair under your desk so you are close enough to maintain good posture.  When working at a computer, place your monitor at a height where you are looking straight ahead and you do not have to tilt your head forward or downward to look at the screen. Resting    When lying down and resting, avoid positions that are most painful for you.  If you have pain with activities such as sitting, bending, stooping, or squatting (flexion-based activities), lie in  a position in which your body does not bend very much. For example, avoid curling up on your side with your arms and knees near your chest (fetal position).  If you have pain with activities such as standing for a long time or reaching with your arms (extension-based activities), lie with your spine in a neutral position and bend your knees slightly. Try the following positions:  Lying on your side with a pillow between your knees.  Lying on your back with a pillow under your knees.   Lifting     When lifting objects, keep your feet at least shoulder-width apart and tighten your abdominal muscles.  Bend your knees and hips and keep your spine neutral. It is important to lift using the strength of your legs, not your back. Do not lock your knees straight out.  Always ask for help to lift heavy or awkward objects. Make sure you discuss any questions you have with your health care provider.

## 2019-06-27 NOTE — Progress Notes (Signed)
Musculoskeletal Exam  Patient: Charles Hudson DOB: Jul 12, 1960  DOS: 06/27/2019  SUBJECTIVE:  Chief Complaint:   Chief Complaint  Patient presents with  . Back Pain    between shoulders and pain worsens when breathing    Charles Hudson is a 59 y.o.  male for evaluation and treatment of upper back pain.   Onset:  1 day ago. No inj or change in activity.  Location: mid upper back Character:  aching  Progression of issue:  is unchanged, constant Associated symptoms: no redness, bruising, swelling Treatment: to date has been: none.   Neurovascular symptoms: no Denies SOB, CP, exertional onset.   History reviewed. No pertinent past medical history.  Objective: VITAL SIGNS: BP 138/86 (BP Location: Left Arm, Patient Position: Sitting, Cuff Size: Normal)   Pulse 84   Temp (!) 95.6 F (35.3 C) (Temporal)   Ht 5\' 6"  (1.676 m)   Wt 149 lb 4 oz (67.7 kg)   SpO2 96%   BMI 24.09 kg/m  Constitutional: Well formed, well developed. No acute distress. Thorax & Lungs: No accessory muscle use Musculoskeletal: upper back.   Good ROM of UE's b/l Tenderness to palpation: mild ttp over midline upper thoracic spine Deformity: no Ecchymosis: no Neurologic: Normal sensory function. No focal deficits noted. DTR's equal and symmetric in UE's. No clonus. Psychiatric: Normal mood. Age appropriate judgment and insight. Alert & oriented x 3.    Assessment:  Acute midline thoracic back pain  Plan: Tylenol, heat, stretches/exercises, ice.  Doubt PE, doubt cardiac etiology.  F/u prn. The patient voiced understanding and agreement to the plan.   [place], DO 06/27/19  2:51 PM

## 2020-08-02 ENCOUNTER — Encounter: Payer: Self-pay | Admitting: Family Medicine

## 2020-08-02 ENCOUNTER — Other Ambulatory Visit: Payer: Self-pay

## 2020-08-02 ENCOUNTER — Ambulatory Visit (INDEPENDENT_AMBULATORY_CARE_PROVIDER_SITE_OTHER): Payer: BC Managed Care – PPO | Admitting: Family Medicine

## 2020-08-02 VITALS — BP 142/84 | HR 87 | Temp 98.1°F | Ht 66.0 in | Wt 154.2 lb

## 2020-08-02 DIAGNOSIS — Z125 Encounter for screening for malignant neoplasm of prostate: Secondary | ICD-10-CM

## 2020-08-02 DIAGNOSIS — Z87891 Personal history of nicotine dependence: Secondary | ICD-10-CM | POA: Diagnosis not present

## 2020-08-02 DIAGNOSIS — Z1159 Encounter for screening for other viral diseases: Secondary | ICD-10-CM

## 2020-08-02 DIAGNOSIS — Z0000 Encounter for general adult medical examination without abnormal findings: Secondary | ICD-10-CM

## 2020-08-02 DIAGNOSIS — Z114 Encounter for screening for human immunodeficiency virus [HIV]: Secondary | ICD-10-CM

## 2020-08-02 NOTE — Progress Notes (Signed)
Chief Complaint  Patient presents with  . Annual Exam    Well Male Charles Hudson is here for a complete physical.   His last physical was >1 year ago.  Current diet: in general, a "pretty healthy" diet.  Current exercise: working Weight trend: increasing Fatigue out of ordinary? No. Seat belt? Yes.    Health maintenance Shingrix- Yes Colonoscopy- No Tetanus- Yes HIV- No Hep C- No Lung cancer screening- Due   Past Medical History:  Diagnosis Date  . No known health problems        Past Surgical History:  Procedure Laterality Date  . KNEE ARTHROSCOPY  1991  . KNEE ARTHROSCOPY    . WISDOM TOOTH EXTRACTION      Medications  Takes no meds routineliy.    Allergies No Known Allergies  Family History Family History  Problem Relation Age of Onset  . Alzheimer's disease Father   . Colon cancer Neg Hx   . Colon polyps Neg Hx   . Esophageal cancer Neg Hx   . Rectal cancer Neg Hx   . Stomach cancer Neg Hx     Review of Systems: Constitutional:  no fevers Eye:  no recent significant change in vision Ear/Nose/Mouth/Throat:  Ears:  no hearing loss Nose/Mouth/Throat:  no complaints of nasal congestion, no sore throat Cardiovascular:  no chest pain Respiratory:  no shortness of breath Gastrointestinal:  no change in bowel habits GU:  Male: negative for dysuria, frequency Musculoskeletal/Extremities:  no joint pain Integumentary (Skin/Breast):  no abnormal skin lesions reported Neurologic:  no headaches Endocrine: No unexpected weight changes Hematologic/Lymphatic:  no abnormal bleeding  Exam BP (!) 142/84 (BP Location: Left Arm, Patient Position: Sitting, Cuff Size: Normal)   Pulse 87   Temp 98.1 F (36.7 C) (Oral)   Ht 5\' 6"  (1.676 m)   Wt 154 lb 4 oz (70 kg)   SpO2 99%   BMI 24.90 kg/m  General:  well developed, well nourished, in no apparent distress Skin:  no significant moles, warts, or growths Head:  no masses, lesions, or tenderness Eyes:  pupils  equal and round, sclera anicteric without injection Ears:  canals without lesions, TMs shiny without retraction, no obvious effusion, no erythema Nose:  nares patent, septum midline, mucosa normal Throat/Pharynx:  lips and gingiva without lesion; tongue and uvula midline; non-inflamed pharynx; no exudates or postnasal drainage Neck: neck supple without adenopathy, thyromegaly, or masses Cardiac: RRR, no bruits, no LE edema Lungs:  clear to auscultation, breath sounds equal bilaterally, no respiratory distress Abdomen: BS+, soft, non-tender, non-distended, no masses or organomegaly noted Rectal: Deferred Musculoskeletal:  symmetrical muscle groups noted without atrophy or deformity Neuro:  gait normal; deep tendon reflexes normal and symmetric Psych: well oriented with normal range of affect and appropriate judgment/insight  Assessment and Plan  Well adult exam - Plan: Comprehensive metabolic panel, CBC, Lipid panel  Stopped smoking with greater than 20 pack year history  Encounter for hepatitis C screening test for low risk patient - Plan: Hepatitis C antibody  Screening for prostate cancer - Plan: PSA  Screening for HIV without presence of risk factors - Plan: HIV Antibody (routine testing w rflx)   Well 60 y.o. male. Counseled on diet and exercise. Counseled on risks and benefits of prostate cancer screening with PSA. The patient agrees to undergo testing. Immunizations, labs, and further orders as above. BP elevated. Monitor at home for next mo. If >140/90 consistently, will schedule next mo.  Will come back for fasting  labs.  Follow up in 1 yr. The patient voiced understanding and agreement to the plan.  [place], DO 08/02/20 1:59 PM

## 2020-08-02 NOTE — Patient Instructions (Signed)
Give [place] 2-3 business days to get the results of your labs back.   Keep the diet clean and stay active.  If you do not hear anything about your referral in the next 1-2 weeks, call our office and ask for an update.  Check your blood pressures 2-3 times per week, alternating the time of day you check it. If it is high, considering waiting 1-2 minutes and rechecking. If it gets higher, your anxiety is likely creeping up and we should avoid rechecking. If greater than 140/90 consistently over the next 4 weeks, I want to see you.  Let us know if you need anything.

## 2020-08-09 ENCOUNTER — Other Ambulatory Visit (INDEPENDENT_AMBULATORY_CARE_PROVIDER_SITE_OTHER): Payer: BC Managed Care – PPO

## 2020-08-09 ENCOUNTER — Other Ambulatory Visit: Payer: Self-pay

## 2020-08-09 DIAGNOSIS — Z114 Encounter for screening for human immunodeficiency virus [HIV]: Secondary | ICD-10-CM | POA: Diagnosis not present

## 2020-08-09 DIAGNOSIS — Z0000 Encounter for general adult medical examination without abnormal findings: Secondary | ICD-10-CM | POA: Diagnosis not present

## 2020-08-09 DIAGNOSIS — Z125 Encounter for screening for malignant neoplasm of prostate: Secondary | ICD-10-CM

## 2020-08-09 DIAGNOSIS — Z1159 Encounter for screening for other viral diseases: Secondary | ICD-10-CM

## 2020-08-09 LAB — COMPREHENSIVE METABOLIC PANEL
ALT: 65 U/L — ABNORMAL HIGH (ref 0–53)
AST: 49 U/L — ABNORMAL HIGH (ref 0–37)
Albumin: 4.4 g/dL (ref 3.5–5.2)
Alkaline Phosphatase: 82 U/L (ref 39–117)
BUN: 9 mg/dL (ref 6–23)
CO2: 28 mEq/L (ref 19–32)
Calcium: 9.3 mg/dL (ref 8.4–10.5)
Chloride: 101 mEq/L (ref 96–112)
Creatinine, Ser: 0.94 mg/dL (ref 0.40–1.50)
GFR: 89.85 mL/min (ref >60.00)
Glucose, Bld: 103 mg/dL — ABNORMAL HIGH (ref 70–99)
Potassium: 4.6 mEq/L (ref 3.5–5.1)
Sodium: 138 mEq/L (ref 135–145)
Total Bilirubin: 1 mg/dL (ref 0.2–1.2)
Total Protein: 7.5 g/dL (ref 6.0–8.3)

## 2020-08-09 LAB — PSA: PSA: 0.88 ng/mL (ref 0.10–4.00)

## 2020-08-09 LAB — CBC
HCT: 49.1 % (ref 39.0–52.0)
Hemoglobin: 16.9 g/dL (ref 13.0–17.0)
MCHC: 34.4 g/dL (ref 30.0–36.0)
MCV: 100.9 fl — ABNORMAL HIGH (ref 78.0–100.0)
Platelets: 232 10*3/uL (ref 150.0–400.0)
RBC: 4.87 Mil/uL (ref 4.22–5.81)
RDW: 13 % (ref 11.5–15.5)
WBC: 5.5 10*3/uL (ref 4.0–10.5)

## 2020-08-09 LAB — LIPID PANEL
Cholesterol: 209 mg/dL — ABNORMAL HIGH (ref 0–200)
HDL: 52.1 mg/dL (ref >39.00)
LDL Cholesterol: 134 mg/dL — ABNORMAL HIGH (ref 0–99)
NonHDL: 156.55
Total CHOL/HDL Ratio: 4
Triglycerides: 115 mg/dL (ref 0.0–149.0)
VLDL: 23 mg/dL (ref 0.0–40.0)

## 2020-08-12 ENCOUNTER — Other Ambulatory Visit: Payer: Self-pay | Admitting: Family Medicine

## 2020-08-12 DIAGNOSIS — R7989 Other specified abnormal findings of blood chemistry: Secondary | ICD-10-CM

## 2020-08-12 DIAGNOSIS — E785 Hyperlipidemia, unspecified: Secondary | ICD-10-CM

## 2020-08-12 LAB — HEPATITIS C ANTIBODY
Hepatitis C Ab: NONREACTIVE
SIGNAL TO CUT-OFF: 0.01 (ref <1.00)

## 2020-08-12 LAB — HIV ANTIBODY (ROUTINE TESTING W REFLEX): HIV 1&2 Ab, 4th Generation: NONREACTIVE

## 2020-08-16 ENCOUNTER — Other Ambulatory Visit: Payer: Self-pay

## 2020-08-16 ENCOUNTER — Ambulatory Visit (INDEPENDENT_AMBULATORY_CARE_PROVIDER_SITE_OTHER): Payer: BC Managed Care – PPO | Admitting: Family Medicine

## 2020-08-16 ENCOUNTER — Encounter: Payer: Self-pay | Admitting: Family Medicine

## 2020-08-16 VITALS — BP 148/90 | HR 86 | Temp 98.7°F | Ht 66.0 in | Wt 154.0 lb

## 2020-08-16 DIAGNOSIS — I10 Essential (primary) hypertension: Secondary | ICD-10-CM

## 2020-08-16 MED ORDER — AMLODIPINE BESYLATE 5 MG PO TABS: 5.0000 mg | ORAL_TABLET | Freq: Every day | ORAL | 3 refills | Status: DC

## 2020-08-16 NOTE — Progress Notes (Signed)
Chief Complaint  Patient presents with   Follow-up    Blood pressure and lab work    Subjective Charles Hudson is a 60 y.o. male who presents for hypertension follow up. He does monitor home blood pressures. Blood pressures ranging from 140-160's/90-100's on average. He is not on any medications right now. +famhx of HTN in dad.  He is adhering to a healthy diet overall. He has cut down on alcohol consumption immensely. Feels quite a bit better.  Current exercise: walking No CP or SOB.    Past Medical History:  Diagnosis Date   No known health problems     Exam BP (!) 148/90   Pulse 86   Temp 98.7 F (37.1 C) (Oral)   Ht 5\' 6"  (1.676 m)   Wt 154 lb (69.9 kg)   SpO2 97%   BMI 24.86 kg/m  General:  well developed, well nourished, in no apparent distress Heart: RRR, no bruits, no LE edema Lungs: clear to auscultation, no accessory muscle use Psych: well oriented with normal range of affect and appropriate judgment/insight  Essential hypertension - Plan: amLODipine (NORVASC) 5 MG tablet  Chronic, uncontrolled. Start Norvasc 5 mg/d. Monitor BP at home. Commended for cutting down on alcohol consumption. Counseled on diet and exercise. F/u in 1 mo, will reck labs at that time, B12, Folate, lipids, hep function panel. The patient voiced understanding and agreement to the plan.  [place], DO 08/16/20  3:37 PM

## 2020-08-16 NOTE — Patient Instructions (Signed)
Keep the diet clean and stay active.  Excellent job cutting down on alcohol consumption. This will help everything with your labs.   Let us know if you need anything.

## 2020-09-13 ENCOUNTER — Other Ambulatory Visit: Payer: Self-pay | Admitting: Family Medicine

## 2020-09-13 ENCOUNTER — Ambulatory Visit (INDEPENDENT_AMBULATORY_CARE_PROVIDER_SITE_OTHER): Payer: BC Managed Care – PPO | Admitting: Family Medicine

## 2020-09-13 ENCOUNTER — Other Ambulatory Visit: Payer: Self-pay

## 2020-09-13 ENCOUNTER — Encounter: Payer: Self-pay | Admitting: Family Medicine

## 2020-09-13 VITALS — BP 146/86 | HR 69 | Temp 98.2°F | Ht 66.0 in | Wt 153.0 lb

## 2020-09-13 DIAGNOSIS — R7401 Elevation of levels of liver transaminase levels: Secondary | ICD-10-CM

## 2020-09-13 DIAGNOSIS — I10 Essential (primary) hypertension: Secondary | ICD-10-CM | POA: Diagnosis not present

## 2020-09-13 DIAGNOSIS — E7800 Pure hypercholesterolemia, unspecified: Secondary | ICD-10-CM | POA: Diagnosis not present

## 2020-09-13 LAB — LIPID PANEL
Cholesterol: 211 mg/dL — ABNORMAL HIGH (ref 0–200)
HDL: 50.8 mg/dL (ref >39.00)
LDL Cholesterol: 144 mg/dL — ABNORMAL HIGH (ref 0–99)
NonHDL: 159.91
Total CHOL/HDL Ratio: 4
Triglycerides: 82 mg/dL (ref 0.0–149.0)
VLDL: 16.4 mg/dL (ref 0.0–40.0)

## 2020-09-13 LAB — HEPATIC FUNCTION PANEL
ALT: 46 U/L (ref 0–53)
AST: 35 U/L (ref 0–37)
Albumin: 4.5 g/dL (ref 3.5–5.2)
Alkaline Phosphatase: 86 U/L (ref 39–117)
Bilirubin, Direct: 0.2 mg/dL (ref 0.0–0.3)
Total Bilirubin: 1 mg/dL (ref 0.2–1.2)
Total Protein: 7.8 g/dL (ref 6.0–8.3)

## 2020-09-13 MED ORDER — ROSUVASTATIN CALCIUM 20 MG PO TABS: 20.0000 mg | ORAL_TABLET | Freq: Every day | ORAL | 3 refills | Status: DC

## 2020-09-13 MED ORDER — AMLODIPINE BESYLATE 10 MG PO TABS: 10.0000 mg | ORAL_TABLET | Freq: Every day | ORAL | 1 refills | Status: DC

## 2020-09-13 NOTE — Patient Instructions (Addendum)
Keep the diet clean and stay active.  Check your blood pressures 2-3 times per week, alternating the time of day you check it. If it is high, considering waiting 1-2 minutes and rechecking. If it gets higher, your anxiety is likely creeping up and we should avoid rechecking.   Take 2 tabs of the Norvasc until you run out and then take a tab daily of the new dosage that was sent in.  Give [place] 2-3 business days to get the results of your labs back.   Let us know if you need anything.

## 2020-09-13 NOTE — Progress Notes (Signed)
Chief Complaint  Patient presents with   Follow-up    Subjective Charles Hudson is a 60 y.o. male who presents for hypertension follow up. He does monitor home blood pressures. Blood pressures ranging from 130-140's/80's on average. He is compliant with medication- started on Norvasc 5 mg/d Patient has these side effects of medication: none He eating healthier.  Current exercise: active in yard and with work No CP or SOB.    Past Medical History:  Diagnosis Date   No known health problems     Exam BP (!) 146/86   Pulse 69   Temp 98.2 F (36.8 C) (Oral)   Ht 5\' 6"  (1.676 m)   Wt 153 lb (69.4 kg)   SpO2 99%   BMI 24.69 kg/m  General:  well developed, well nourished, in no apparent distress Heart: RRR, no bruits, no LE edema Lungs: clear to auscultation, no accessory muscle use Psych: well oriented with normal range of affect and appropriate judgment/insight  Essential hypertension  Transaminitis  Pure hypercholesterolemia - Plan: Hepatic function panel, Lipid panel  Chronic, uncontrolled.  Increase dosage of Norvasc from 5 mg daily to 10 mg daily.  Continue to monitor blood pressure at home.  Goal is less than 140/90.  Counseled on diet and exercise.  It sounds like his diet has improved. We will recheck labs today. F/u in 1 month. The patient voiced understanding and agreement to the plan.  [place], DO 09/13/20  8:09 AM

## 2020-09-27 ENCOUNTER — Other Ambulatory Visit: Payer: BC Managed Care – PPO

## 2020-10-18 ENCOUNTER — Ambulatory Visit: Payer: BLUE CROSS/BLUE SHIELD | Admitting: Family Medicine

## 2020-10-18 ENCOUNTER — Other Ambulatory Visit: Payer: Self-pay

## 2020-10-18 ENCOUNTER — Encounter: Payer: Self-pay | Admitting: Family Medicine

## 2020-10-18 VITALS — BP 136/70 | HR 73 | Temp 97.8°F | Ht 67.0 in | Wt 153.1 lb

## 2020-10-18 DIAGNOSIS — I10 Essential (primary) hypertension: Secondary | ICD-10-CM

## 2020-10-18 DIAGNOSIS — E7800 Pure hypercholesterolemia, unspecified: Secondary | ICD-10-CM | POA: Diagnosis not present

## 2020-10-18 LAB — LIPID PANEL
Cholesterol: 120 mg/dL (ref 0–200)
HDL: 57.3 mg/dL (ref >39.00)
LDL Cholesterol: 51 mg/dL (ref 0–99)
NonHDL: 63.17
Total CHOL/HDL Ratio: 2
Triglycerides: 62 mg/dL (ref 0.0–149.0)
VLDL: 12.4 mg/dL (ref 0.0–40.0)

## 2020-10-18 LAB — HEPATIC FUNCTION PANEL
ALT: 44 U/L (ref 0–53)
AST: 34 U/L (ref 0–37)
Albumin: 4.3 g/dL (ref 3.5–5.2)
Alkaline Phosphatase: 79 U/L (ref 39–117)
Bilirubin, Direct: 0.2 mg/dL (ref 0.0–0.3)
Total Bilirubin: 0.9 mg/dL (ref 0.2–1.2)
Total Protein: 7.5 g/dL (ref 6.0–8.3)

## 2020-10-18 NOTE — Patient Instructions (Addendum)
Give [place] 2-3 business days to get the results of your labs back.   Keep the diet clean and stay active.  Because your blood pressure is well-controlled, you no longer have to check your blood pressure at home anymore unless you wish. Some people check it twice daily every day and some people stop altogether. Either or anything in between is fine. Strong work!  I recommend getting the flu shot in mid October. This suggestion would change if the CDC comes out with a different recommendation.   Let us know if you need anything.

## 2020-10-18 NOTE — Progress Notes (Signed)
Chief Complaint  Patient presents with   Follow-up    Lab work today    Subjective Charles Hudson is a 60 y.o. male who presents for hypertension follow up. He does monitor home blood pressures. Blood pressures ranging from 130's/70's on average. He is compliant with medication: Norvasc 10 mg/d. Patient has these side effects of medication: none He is usually adhering to a healthy diet overall. Current exercise: Active at work No Cp or SOB.   Hyperlipidemia Patient presents for hyperlipidemia follow up. Currently being treated with Crestor 20 mg/d and compliance with treatment thus far has been good. He denies myalgias. Diet/exercise as above.  The patient is not known to have coexisting coronary artery disease.   Past Medical History:  Diagnosis Date   No known health problems     Exam BP 136/70   Pulse 73   Temp 97.8 F (36.6 C) (Oral)   Ht '5\' 7"'$  (1.702 m)   Wt 153 lb 2 oz (69.5 kg)   SpO2 97%   BMI 23.98 kg/m  General:  well developed, well nourished, in no apparent distress Heart: RRR, no bruits, no LE edema Lungs: clear to auscultation, no accessory muscle use Psych: well oriented with normal range of affect and appropriate judgment/insight  Essential hypertension  Pure hypercholesterolemia - Plan: Hepatic function panel, Lipid panel  Chronic, stable. Cont Norvasc 10 mg/d. OK to stop checking blood pressure at home. Counseled on diet and exercise. Chronic, probably stable. Cont Crestor 20 mg/d. Ck labs today.  F/u in 6 mo. The patient voiced understanding and agreement to the plan.  [place], DO 10/18/20  8:10 AM

## 2021-03-11 ENCOUNTER — Other Ambulatory Visit: Payer: Self-pay | Admitting: Family Medicine

## 2021-04-18 ENCOUNTER — Ambulatory Visit: Payer: BLUE CROSS/BLUE SHIELD | Admitting: Family Medicine

## 2021-04-18 ENCOUNTER — Encounter: Payer: Self-pay | Admitting: Family Medicine

## 2021-04-18 VITALS — BP 138/80 | HR 72 | Temp 98.3°F | Ht 66.0 in | Wt 158.0 lb

## 2021-04-18 DIAGNOSIS — I10 Essential (primary) hypertension: Secondary | ICD-10-CM

## 2021-04-18 DIAGNOSIS — L821 Other seborrheic keratosis: Secondary | ICD-10-CM | POA: Diagnosis not present

## 2021-04-18 DIAGNOSIS — E7800 Pure hypercholesterolemia, unspecified: Secondary | ICD-10-CM | POA: Diagnosis not present

## 2021-04-18 LAB — COMPREHENSIVE METABOLIC PANEL
ALT: 33 U/L (ref 0–53)
AST: 29 U/L (ref 0–37)
Albumin: 4.5 g/dL (ref 3.5–5.2)
Alkaline Phosphatase: 62 U/L (ref 39–117)
BUN: 9 mg/dL (ref 6–23)
CO2: 32 mEq/L (ref 19–32)
Calcium: 9.1 mg/dL (ref 8.4–10.5)
Chloride: 101 mEq/L (ref 96–112)
Creatinine, Ser: 0.85 mg/dL (ref 0.40–1.50)
GFR: 95.85 mL/min (ref >60.00)
Glucose, Bld: 102 mg/dL — ABNORMAL HIGH (ref 70–99)
Potassium: 4.1 mEq/L (ref 3.5–5.1)
Sodium: 137 mEq/L (ref 135–145)
Total Bilirubin: 1.1 mg/dL (ref 0.2–1.2)
Total Protein: 7.3 g/dL (ref 6.0–8.3)

## 2021-04-18 LAB — LIPID PANEL
Cholesterol: 147 mg/dL (ref 0–200)
HDL: 59.3 mg/dL (ref >39.00)
LDL Cholesterol: 76 mg/dL (ref 0–99)
NonHDL: 87.87
Total CHOL/HDL Ratio: 2
Triglycerides: 57 mg/dL (ref 0.0–149.0)
VLDL: 11.4 mg/dL (ref 0.0–40.0)

## 2021-04-18 NOTE — Patient Instructions (Signed)
Give [place] 2-3 business days to get the results of your labs back.   Keep the diet clean and stay active.  Increase hydration from 3 bottles of water to 3.5-4 bottles daily. Stop the Crestor for 2 weeks. Restart after that. Let me know if it gets better and then worse again.   Heat (pad or rice pillow in microwave) over affected area, 10-15 minutes twice daily.   Ice/cold pack over area for 10-15 min twice daily.  The area on your back is a seborrheic keratosis which is not concerning.   Let us know if you need anything.  Trapezius stretches/exercises Do exercises exactly as told by your health care provider and adjust them as directed. It is normal to feel mild stretching, pulling, tightness, or discomfort as you do these exercises, but you should stop right away if you feel sudden pain or your pain gets worse.   Stretching and range of motion exercises These exercises warm up your muscles and joints and improve the movement and flexibility of your shoulder. These exercises can also help to relieve pain, numbness, and tingling. If you are unable to do any of the following for any reason, do not further attempt to do it.   Exercise A: Flexion, standing     Stand and hold a broomstick, a cane, or a similar object. Place your hands a little more than shoulder-width apart on the object. Your left / right hand should be palm-up, and your other hand should be palm-down. Push the stick to raise your left / right arm out to your side and then over your head. Use your other hand to help move the stick. Stop when you feel a stretch in your shoulder, or when you reach the angle that is recommended by your health care provider. Avoid shrugging your shoulder while you raise your arm. Keep your shoulder blade tucked down toward your spine. Hold for 30 seconds. Slowly return to the starting position. Repeat 2 times. Complete this exercise 3 times per week.  Exercise B: Abduction, supine     Lie on  your back and hold a broomstick, a cane, or a similar object. Place your hands a little more than shoulder-width apart on the object. Your left / right hand should be palm-up, and your other hand should be palm-down. Push the stick to raise your left / right arm out to your side and then over your head. Use your other hand to help move the stick. Stop when you feel a stretch in your shoulder, or when you reach the angle that is recommended by your health care provider. Avoid shrugging your shoulder while you raise your arm. Keep your shoulder blade tucked down toward your spine. Hold for 30 seconds. Slowly return to the starting position. Repeat 2 times. Complete this exercise 3 times per week.  Exercise C: Flexion, active-assisted     Lie on your back. You may bend your knees for comfort. Hold a broomstick, a cane, or a similar object. Place your hands about shoulder-width apart on the object. Your palms should face toward your feet. Raise the stick and move your arms over your head and behind your head, toward the floor. Use your healthy arm to help your left / right arm move farther. Stop when you feel a gentle stretch in your shoulder, or when you reach the angle where your health care provider tells you to stop. Hold for 30 seconds. Slowly return to the starting position. Repeat 2 times.  Complete this exercise 3 times per week.  Exercise D: External rotation and abduction     Stand in a door frame with one of your feet slightly in front of the other. This is called a staggered stance. Choose one of the following positions as told by your health care provider: Place your hands and forearms on the door frame above your head. Place your hands and forearms on the door frame at the height of your head. Place your hands on the door frame at the height of your elbows. Slowly move your weight onto your front foot until you feel a stretch across your chest and in the front of your shoulders.  Keep your head and chest upright and keep your abdominal muscles tight. Hold for 30 seconds. To release the stretch, shift your weight to your back foot. Repeat 2 times. Complete this stretch 3 times per week.  Strengthening exercises These exercises build strength and endurance in your shoulder. Endurance is the ability to use your muscles for a long time, even after your muscles get tired. Exercise E: Scapular depression and adduction  Sit on a stable chair. Support your arms in front of you with pillows, armrests, or a tabletop. Keep your elbows in line with the sides of your body. Gently move your shoulder blades down toward your middle back. Relax the muscles on the tops of your shoulders and in the back of your neck. Hold for 3 seconds. Slowly release the tension and relax your muscles completely before doing this exercise again. Repeat for a total of 10 repetitions. After you have practiced this exercise, try doing the exercise without the arm support. Then, try the exercise while standing instead of sitting. Repeat 2 times. Complete this exercise 3 times per week.  Exercise F: Shoulder abduction, isometric     Stand or sit about 4-6 inches (10-15 cm) from a wall with your left / right side facing the wall. Bend your left / right elbow and gently press your elbow against the wall. Increase the pressure slowly until you are pressing as hard as you can without shrugging your shoulder. Hold for 3 seconds. Slowly release the tension and relax your muscles completely. Repeat for a total of 10 repetitions. Repeat 2 times. Complete this exercise 3 times per week.  Exercise G: Shoulder flexion, isometric     Stand or sit about 4-6 inches (10-15 cm) away from a wall with your left / right side facing the wall. Keep your left / right elbow straight and gently press the top of your fist against the wall. Increase the pressure slowly until you are pressing as hard as you can without shrugging  your shoulder. Hold for 10-15 seconds. Slowly release the tension and relax your muscles completely. Repeat for a total of 10 repetitions. Repeat 2 times. Complete this exercise 3 times per week.  Exercise H: Internal rotation     Sit in a stable chair without armrests, or stand. Secure an exercise band at your left / right side, at elbow height. Place a soft object, such as a folded towel or a small pillow, under your left / right upper arm so your elbow is a few inches (about 8 cm) away from your side. Hold the end of the exercise band so the band stretches. Keeping your elbow pressed against the soft object under your arm, move your forearm across your body toward your abdomen. Keep your body steady so the movement is only coming from your  shoulder. Hold for 3 seconds. Slowly return to the starting position. Repeat for a total of 10 repetitions. Repeat 2 times. Complete this exercise 3 times per week.  Exercise I: External rotation     Sit in a stable chair without armrests, or stand. Secure an exercise band at your left / right side, at elbow height. Place a soft object, such as a folded towel or a small pillow, under your left / right upper arm so your elbow is a few inches (about 8 cm) away from your side. Hold the end of the exercise band so the band stretches. Keeping your elbow pressed against the soft object under your arm, move your forearm out, away from your abdomen. Keep your body steady so the movement is only coming from your shoulder. Hold for 3 seconds. Slowly return to the starting position. Repeat for a total of 10 repetitions. Repeat 2 times. Complete this exercise 3 times per week. Exercise J: Shoulder extension  Sit in a stable chair without armrests, or stand. Secure an exercise band to a stable object in front of you so the band is at shoulder height. Hold one end of the exercise band in each hand. Your palms should face each other. Straighten your elbows and  lift your hands up to shoulder height. Step back, away from the secured end of the exercise band, until the band stretches. Squeeze your shoulder blades together and pull your hands down to the sides of your thighs. Stop when your hands are straight down by your sides. Do not let your hands go behind your body. Hold for 3 seconds. Slowly return to the starting position. Repeat for a total of 10 repetitions. Repeat 2 times. Complete this exercise 3 times per week.  Exercise K: Shoulder extension, prone     Lie on your abdomen on a firm surface so your left / right arm hangs over the edge. Hold a 5 lb weight in your hand so your palm faces in toward your body. Your arm should be straight. Squeeze your shoulder blade down toward the middle of your back. Slowly raise your arm behind you, up to the height of the surface that you are lying on. Keep your arm straight. Hold for 3 seconds. Slowly return to the starting position and relax your muscles. Repeat for a total of 10 repetitions. Repeat 2 times. Complete this exercise 3 times per week.   Exercise L: Horizontal abduction, prone  Lie on your abdomen on a firm surface so your left / right arm hangs over the edge. Hold a 5 lb weight in your hand so your palm faces toward your feet. Your arm should be straight. Squeeze your shoulder blade down toward the middle of your back. Bend your elbow so your hand moves up, until your elbow is bent to an "L" shape (90 degrees). With your elbow bent, slowly move your forearm forward and up. Raise your hand up to the height of the surface that you are lying on. Your upper arm should not move, and your elbow should stay bent. At the top of the movement, your palm should face the floor. Hold for 3 seconds. Slowly return to the starting position and relax your muscles. Repeat for a total of 10 repetitions. Repeat 2 times. Complete this exercise 3 times per week.  Exercise M: Horizontal abduction, standing   Sit on a stable chair, or stand. Secure an exercise band to a stable object in front of you so the  band is at shoulder height. Hold one end of the exercise band in each hand. Straighten your elbows and lift your hands straight in front of you, up to shoulder height. Your palms should face down, toward the floor. Step back, away from the secured end of the exercise band, until the band stretches. Move your arms out to your sides, and keep your arms straight. Hold for 3 seconds. Slowly return to the starting position. Repeat for a total of 10 repetitions. Repeat 2 times. Complete this exercise 3 times per week.  Exercise N: Scapular retraction and elevation  Sit on a stable chair, or stand. Secure an exercise band to a stable object in front of you so the band is at shoulder height. Hold one end of the exercise band in each hand. Your palms should face each other. Sit in a stable chair without armrests, or stand. Step back, away from the secured end of the exercise band, until the band stretches. Squeeze your shoulder blades together and lift your hands over your head. Keep your elbows straight. Hold for 3 seconds. Slowly return to the starting position. Repeat for a total of 10 repetitions. Repeat 2 times. Complete this exercise 3 times per week.  This information is not intended to replace advice given to you by your health care provider. Make sure you discuss any questions you have with your health care provider. Document Released: 02/16/2005 Document Revised: 10/24/2015 Document Reviewed: 01/03/2015 Elsevier Interactive Patient Education  2017 [employer].

## 2021-04-18 NOTE — Progress Notes (Signed)
Chief Complaint  Patient presents with   Follow-up    6 month Shoulder pain/both are hurting    Subjective Charles Hudson is a 61 y.o. male who presents for hypertension follow up. He does not monitor home blood pressures. He is compliant with medication- Norvasc 5 mg/d. Patient has these side effects of medication: none He is sometimes adhering to a healthy diet overall. Current exercise: active at work No Cp or SOB  Hyperlipidemia Patient presents for dyslipidemia follow up. Currently being treated with Crestor 20 mg/d and compliance with treatment thus far has been good. He has shoulder pain.  Diet/exercise as above.  The patient is not known to have coexisting coronary artery disease.   Past Medical History:  Diagnosis Date   Essential hypertension    Hyperlipidemia     Exam BP 138/80    Pulse 72    Temp 98.3 F (36.8 C) (Oral)    Ht 5\' 6"  (1.676 m)    Wt 158 lb (71.7 kg)    SpO2 99%    BMI 25.50 kg/m  General:  well developed, well nourished, in no apparent distress Heart: RRR, no bruits, no LE edema Lungs: clear to auscultation, no accessory muscle use MSK: Mild ttp over b/l traps, neg Spurling's Psych: well oriented with normal range of affect and appropriate judgment/insight  Essential hypertension  Pure hypercholesterolemia - Plan: Comprehensive metabolic panel, Lipid panel  Chronic, stable.  Continue Norvasc 5 mg daily.  Counseled on diet and exercise. Chronic, stable.  Stop Crestor 20 mg daily for 2 weeks and then restart.  If he never gets better, this is likely simple strain.  He was given stretches and exercises for this.  He does get better, he will restart and hopefully not have further issues as 77% of people do not continue to have statin myalgias.  He will let me know if he gets worse again though.  Hydration encouraged.  If he gets it again, we will trial a low-dose of atorvastatin. Reassurance for the skin lesion on his back.  Offered to take it off if  it would give either him or his wife peace of mind.  He politely declines today. F/u in 6 months for a physical or as needed. The patient voiced understanding and agreement to the plan.  [place], DO 04/18/21  8:07 AM

## 2021-06-15 ENCOUNTER — Other Ambulatory Visit: Payer: Self-pay | Admitting: Family Medicine

## 2021-07-08 ENCOUNTER — Encounter: Payer: Self-pay | Admitting: Family Medicine

## 2021-07-08 ENCOUNTER — Ambulatory Visit: Payer: BLUE CROSS/BLUE SHIELD | Admitting: Family Medicine

## 2021-07-08 VITALS — BP 140/82 | HR 90 | Temp 98.5°F | Ht 66.0 in | Wt 160.4 lb

## 2021-07-08 DIAGNOSIS — M65312 Trigger thumb, left thumb: Secondary | ICD-10-CM | POA: Diagnosis not present

## 2021-07-08 DIAGNOSIS — M65311 Trigger thumb, right thumb: Secondary | ICD-10-CM

## 2021-07-08 MED ORDER — METHYLPREDNISOLONE ACETATE 40 MG/ML IJ SUSP
40.0000 mg | Freq: Once | INTRAMUSCULAR | Status: AC
  Administered 2021-07-08: 40 mg via INTRA_ARTICULAR

## 2021-07-08 NOTE — Progress Notes (Signed)
Musculoskeletal Exam ? ?Patient: Charles Hudson DOB: [birth year] ? ?DOS: 07/08/2021 ? ?SUBJECTIVE: ? ?Chief Complaint:  ? ?Chief Complaint  ?Patient presents with  ? thumb pain  ? ? ?Charles Hudson is a 61 y.o.  male for evaluation and treatment of b/l thumb pain.  ? ?Onset:  2 months ago. No inj or change in activity.  ?Location: base of thumbs ?Character:  aching and sharp  ?Progression of issue:  has worsened ?Associated symptoms: catching/popping ?Treatment: to date has been Voltaren gel, ice, heat, topical menthol.   ?Neurovascular symptoms: no ? ?Past Medical History:  ?Diagnosis Date  ? Essential hypertension   ? Hyperlipidemia   ? ? ?Objective: ?VITAL SIGNS: BP 140/82   Pulse 90   Temp 98.5 ?F (36.9 ?C) (Oral)   Ht '5\' 6"'$  (1.676 m)   Wt 160 lb 6 oz (72.7 kg)   SpO2 97%   BMI 25.89 kg/m?  ?Constitutional: Well formed, well developed. No acute distress. ?Thorax & Lungs: No accessory muscle use ?Musculoskeletal: Thumbs.   ?Normal active range of motion: yes.   ?Normal passive range of motion: yes ?Tenderness to palpation: yes over distal palm prior to prox phalanx #1 b/l ?Deformity: no ?Ecchymosis: no ?There is triggering with flexion into extension ?Neurologic: Normal sensory function. No focal deficits noted. DTR's equal and symmetric in LE's. No clonus. ?Psychiatric: Normal mood. Age appropriate judgment and insight. Alert & oriented x 3.   ? ?Procedure note; trigger thumb injection bilaterally ?Verbal consent obtained. ?The areas of interests were demarcated with an otoscope speculum tip just proximal to the proximal first phalanx on the palmar surface. ?The areas were then cleaned with alcohol. ?A 30-gauge needle was inserted at a 45 degree angle and withdrawn to ensure no blood was seen. ?0.5 mL of 1% lidocaine without epinephrine and 20 mg of Depo-Medrol was injected with ease.  This was then repeated on the contralateral side. ?The areas were then bandaged. ?There were no complications noted. ?The patient  tolerated the procedure well. ? ?Assessment: ? ?Trigger thumb of both thumbs - Plan: methylPREDNISolone acetate (DEPO-MEDROL) injection 40 mg ? ?Plan: ?Heat, ice, Tylenol.  If no improvement in the next week, will refer to the hand surgery team.  He will let me know or his wife will call for him. ?F/u as originally scheduled. ?The patient voiced understanding and agreement to the plan. ? ? ?[place], DO ?07/08/21  ?4:21 PM ? ?

## 2021-07-08 NOTE — Patient Instructions (Signed)
We will know in the next week if we aren't turning the corner and we will set you up with the hand team.  ? ?Ice/cold pack over area for 10-15 min twice daily. ? ?OK to take Tylenol 1000 mg (2 extra strength tabs) or 975 mg (3 regular strength tabs) every 6 hours as needed. ? ?Let us know if you need anything. ?

## 2021-07-15 ENCOUNTER — Telehealth: Payer: Self-pay | Admitting: Family Medicine

## 2021-07-15 NOTE — Telephone Encounter (Signed)
Patient called to let PCP know thumbs 90% better pain wise. ?But still popping ?

## 2021-07-16 NOTE — Telephone Encounter (Signed)
Send message in another week if no better and we will refer to hand. Ty.  ?

## 2021-07-16 NOTE — Telephone Encounter (Signed)
Called informed the patients wife to let us know if still continuing to have problems in one week and PCP will refer to hand specialist. ?She agreed to do  ?

## 2021-07-24 ENCOUNTER — Telehealth: Payer: Self-pay | Admitting: Family Medicine

## 2021-07-24 NOTE — Telephone Encounter (Signed)
Already done the injection with wendling and still not 100%   Wendling said he would refer to Dr Amedeo Plenty   Can we send in the referral for them?

## 2021-07-25 ENCOUNTER — Other Ambulatory Visit: Payer: Self-pay | Admitting: Family Medicine

## 2021-07-25 DIAGNOSIS — M65311 Trigger thumb, right thumb: Secondary | ICD-10-CM

## 2021-07-25 NOTE — Telephone Encounter (Signed)
Referral done

## 2021-09-05 ENCOUNTER — Other Ambulatory Visit: Payer: Self-pay | Admitting: Family Medicine

## 2021-10-20 ENCOUNTER — Encounter: Payer: Self-pay | Admitting: Family Medicine

## 2021-10-20 ENCOUNTER — Other Ambulatory Visit: Payer: Self-pay | Admitting: Family Medicine

## 2021-10-20 ENCOUNTER — Telehealth: Payer: Self-pay

## 2021-10-20 ENCOUNTER — Ambulatory Visit (INDEPENDENT_AMBULATORY_CARE_PROVIDER_SITE_OTHER): Payer: BLUE CROSS/BLUE SHIELD | Admitting: Family Medicine

## 2021-10-20 VITALS — BP 136/82 | HR 78 | Temp 98.1°F | Ht 66.0 in | Wt 160.0 lb

## 2021-10-20 DIAGNOSIS — D489 Neoplasm of uncertain behavior, unspecified: Secondary | ICD-10-CM

## 2021-10-20 DIAGNOSIS — R799 Abnormal finding of blood chemistry, unspecified: Secondary | ICD-10-CM

## 2021-10-20 DIAGNOSIS — Z0000 Encounter for general adult medical examination without abnormal findings: Secondary | ICD-10-CM | POA: Diagnosis not present

## 2021-10-20 DIAGNOSIS — R972 Elevated prostate specific antigen [PSA]: Secondary | ICD-10-CM

## 2021-10-20 DIAGNOSIS — Z125 Encounter for screening for malignant neoplasm of prostate: Secondary | ICD-10-CM | POA: Diagnosis not present

## 2021-10-20 DIAGNOSIS — Z87891 Personal history of nicotine dependence: Secondary | ICD-10-CM

## 2021-10-20 DIAGNOSIS — R739 Hyperglycemia, unspecified: Secondary | ICD-10-CM

## 2021-10-20 DIAGNOSIS — B078 Other viral warts: Secondary | ICD-10-CM | POA: Diagnosis not present

## 2021-10-20 LAB — PSA: PSA: 1.48 ng/mL (ref 0.10–4.00)

## 2021-10-20 LAB — COMPREHENSIVE METABOLIC PANEL
ALT: 31 U/L (ref 0–53)
AST: 26 U/L (ref 0–37)
Albumin: 4.2 g/dL (ref 3.5–5.2)
Alkaline Phosphatase: 70 U/L (ref 39–117)
BUN: 8 mg/dL (ref 6–23)
CO2: 28 mEq/L (ref 19–32)
Calcium: 8.8 mg/dL (ref 8.4–10.5)
Chloride: 97 mEq/L (ref 96–112)
Creatinine, Ser: 1 mg/dL (ref 0.40–1.50)
GFR: 82.72 mL/min (ref >60.00)
Glucose, Bld: 109 mg/dL — ABNORMAL HIGH (ref 70–99)
Potassium: 3.8 mEq/L (ref 3.5–5.1)
Sodium: 135 mEq/L (ref 135–145)
Total Bilirubin: 1.2 mg/dL (ref 0.2–1.2)
Total Protein: 7.4 g/dL (ref 6.0–8.3)

## 2021-10-20 LAB — CBC
HCT: 54.8 % — ABNORMAL HIGH (ref 39.0–52.0)
Hemoglobin: 18.5 g/dL (ref 13.0–17.0)
MCHC: 33.8 g/dL (ref 30.0–36.0)
MCV: 103.6 fl — ABNORMAL HIGH (ref 78.0–100.0)
Platelets: 250 10*3/uL (ref 150.0–400.0)
RBC: 5.28 Mil/uL (ref 4.22–5.81)
RDW: 14.8 % (ref 11.5–15.5)
WBC: 5.5 10*3/uL (ref 4.0–10.5)

## 2021-10-20 LAB — LIPID PANEL
Cholesterol: 170 mg/dL (ref 0–200)
HDL: 44.7 mg/dL (ref >39.00)
LDL Cholesterol: 106 mg/dL — ABNORMAL HIGH (ref 0–99)
NonHDL: 125.32
Total CHOL/HDL Ratio: 4
Triglycerides: 96 mg/dL (ref 0.0–149.0)
VLDL: 19.2 mg/dL (ref 0.0–40.0)

## 2021-10-20 NOTE — Telephone Encounter (Signed)
CRITICAL VALUE STICKER  CRITICAL VALUE: Hemoglobin 18.5  RECEIVER (on-site recipient of call): Analeia Ismael, RMA  DATE & TIME NOTIFIED: 10/20/2021 '@12'$ :45 pm  MESSENGER (representative from lab): Johnnette Gourd  MD NOTIFIED: Riki Sheer, DO  TIME OF NOTIFICATION: 12:48 pm  RESPONSE:

## 2021-10-20 NOTE — Patient Instructions (Addendum)
Give [place] 2-3 business days to get the results of your labs back.   Keep the diet clean and stay active.  I recommend getting the flu shot in mid October. This suggestion would change if the CDC comes out with a different recommendation.   If you do not hear anything about your referral in the next 1-2 weeks, call our office and ask for an update.  Please get me a copy of your advanced directive form at your convenience.   Give [place] 1 week to get the results of your biopsy back.  Do not shower for the rest of the day. When you do wash it, use only soap and water. Do not vigorously scrub. Apply triple antibiotic ointment (like Neosporin) twice daily. Keep the area clean and dry.   Things to look out for: increasing pain not relieved by ibuprofen/acetaminophen, fevers, spreading redness, drainage of pus, or foul odor.  Let us know if you need anything.

## 2021-10-20 NOTE — Progress Notes (Addendum)
Chief Complaint  Patient presents with   Annual Exam    Remove mole    Well Male Charles Hudson is here for a complete physical.   His last physical was >1 year ago.  Current diet: in general, a "healthy" diet. Improving.  Current exercise: active at work Weight trend: stable Fatigue out of ordinary? No. Seat belt? Yes.   Advanced directive? Yes  Health maintenance Shingrix- Yes Colonoscopy- Yes Tetanus- Yes HIV- Yes Hep C- Yes Lung cancer screening- Due  Skin lesion Raised lesion on the front side of his forearm which has been present for over a year.  There is no pain or drainage but it is itchy and irritating to him.  He would like it removed.  He does not think it is growing.  No new lotions, soaps, or topicals.  No close contacts with similar lesions.  He has not tried anything at home so far.   Past Medical History:  Diagnosis Date   Essential hypertension    Hyperlipidemia       Past Surgical History:  Procedure Laterality Date   KNEE ARTHROSCOPY  1991   KNEE ARTHROSCOPY     WISDOM TOOTH EXTRACTION      Medications  Current Outpatient Medications on File Prior to Visit  Medication Sig Dispense Refill   amLODipine (NORVASC) 10 MG tablet Take 1 tablet by mouth once daily 90 tablet 0   Allergies No Known Allergies  Family History Family History  Problem Relation Age of Onset   Alzheimer's disease Father    Colon cancer Neg Hx    Colon polyps Neg Hx    Esophageal cancer Neg Hx    Rectal cancer Neg Hx    Stomach cancer Neg Hx     Review of Systems: Constitutional:  no fevers Eye:  no recent significant change in vision Ear/Nose/Mouth/Throat:  Ears:  no hearing loss Nose/Mouth/Throat:  no complaints of nasal congestion, no sore throat Cardiovascular:  no chest pain Respiratory:  no shortness of breath Gastrointestinal:  no change in bowel habits GU:  Male: negative for dysuria, frequency Musculoskeletal/Extremities:  no joint pain Integumentary  (Skin/Breast): + Skin lesion on forearm Neurologic:  no headaches Endocrine: No unexpected weight changes Hematologic/Lymphatic:  no abnormal bleeding  Exam BP 136/82 (BP Location: Left Arm, Cuff Size: Normal)   Pulse 78   Temp 98.1 F (36.7 C) (Oral)   Ht '5\' 6"'$  (1.676 m)   Wt 160 lb (72.6 kg)   SpO2 98%   BMI 25.82 kg/m  General:  well developed, well nourished, in no apparent distress Skin:  0.4 x 0.5 elliptically shaped and scaly and raised lesion on distal 1/3 of forearm, volar surface, no ttp, redness, drainage; otherwise no significant moles, warts, or growths Head:  no masses, lesions, or tenderness Eyes:  pupils equal and round, sclera anicteric without injection Ears:  canals without lesions, TMs shiny without retraction, no obvious effusion, no erythema Nose:  nares patent, septum deviated to the right, mucosa normal Throat/Pharynx:  lips and gingiva without lesion; tongue and uvula midline; non-inflamed pharynx; no exudates or postnasal drainage Neck: neck supple without adenopathy, thyromegaly, or masses Cardiac: RRR, no bruits, no LE edema Lungs:  clear to auscultation, breath sounds equal bilaterally, no respiratory distress Abdomen: BS+, soft, non-tender, non-distended, no masses or organomegaly noted Rectal: Deferred Musculoskeletal:  symmetrical muscle groups noted without atrophy or deformity Neuro:  gait normal; deep tendon reflexes normal and symmetric Psych: well oriented with normal range of  affect and appropriate judgment/insight  Procedure note; shave biopsy; indication is diagnosis and therapeutic effect Informed consent was obtained. The area was cleaned with alcohol and injected with 1 mL of 1% lidocaine with epinephrine. A Dermablade was slightly bent and used to cut under the area of interest. The specimen was placed in a sterile specimen cup and sent to the lab. The area was then cauterized ensuring adequate hemostasis. The area was dressed with  triple antibiotic ointment and a bandage. There were no complications noted. The patient tolerated the procedure well.  Assessment and Plan  Well adult exam - Plan: CBC, Comprehensive metabolic panel, Lipid panel  Stopped smoking with greater than 20 pack year history - Plan: Ambulatory Referral Lung Cancer Screening [facility]  Screening for prostate cancer - Plan: PSA  Neoplasm of uncertain behavior - Plan: PR SHAV SKIN LES < 0.5 CM TRUNK,ARM,LEG, Surgical pathology( [place])   Well 61 y.o. male. Counseled on diet and exercise. Counseled on risks and benefits of prostate cancer screening with PSA. The patient agrees to undergo testing. Advanced directive form requested today.  Immunizations, labs, and further orders as above. Skin lesion: Due to irritating nature, will biopsy/shave today.  Aftercare instructions verbalized and written down. Warning signs and symptoms verbalized and written down in AVS.  Follow up in 6 mo. The patient voiced understanding and agreement to the plan.  [place], DO 10/20/21 9:31 AM

## 2021-10-20 NOTE — Telephone Encounter (Signed)
Noted, will address in lab result note.

## 2021-11-14 ENCOUNTER — Other Ambulatory Visit (INDEPENDENT_AMBULATORY_CARE_PROVIDER_SITE_OTHER): Payer: BLUE CROSS/BLUE SHIELD

## 2021-11-14 ENCOUNTER — Telehealth: Payer: Self-pay

## 2021-11-14 ENCOUNTER — Other Ambulatory Visit: Payer: Self-pay | Admitting: Family Medicine

## 2021-11-14 DIAGNOSIS — R972 Elevated prostate specific antigen [PSA]: Secondary | ICD-10-CM

## 2021-11-14 DIAGNOSIS — D582 Other hemoglobinopathies: Secondary | ICD-10-CM

## 2021-11-14 DIAGNOSIS — R799 Abnormal finding of blood chemistry, unspecified: Secondary | ICD-10-CM

## 2021-11-14 DIAGNOSIS — R739 Hyperglycemia, unspecified: Secondary | ICD-10-CM

## 2021-11-14 LAB — PSA: PSA: 2.15 ng/mL (ref 0.10–4.00)

## 2021-11-14 LAB — CBC
HCT: 54 % — ABNORMAL HIGH (ref 39.0–52.0)
Hemoglobin: 18.4 g/dL (ref 13.0–17.0)
MCHC: 34.1 g/dL (ref 30.0–36.0)
MCV: 103.8 fl — ABNORMAL HIGH (ref 78.0–100.0)
Platelets: 232 10*3/uL (ref 150.0–400.0)
RBC: 5.2 Mil/uL (ref 4.22–5.81)
RDW: 14 % (ref 11.5–15.5)
WBC: 5.8 10*3/uL (ref 4.0–10.5)

## 2021-11-14 LAB — HEMOGLOBIN A1C: Hgb A1c MFr Bld: 5.2 % (ref 4.6–6.5)

## 2021-11-14 NOTE — Telephone Encounter (Signed)
Please refer to hematology, dx elevated hemoglobin. Could be related to smoking but I want a specialist to see him to rule out other causes. Ty.

## 2021-11-14 NOTE — Telephone Encounter (Signed)
Patients wife informed of results/referral

## 2021-11-14 NOTE — Telephone Encounter (Signed)
CRITICAL VALUE STICKER  CRITICAL VALUE: Hemoglobin 18.4  RECEIVER (on-site recipient of call): Sarha Bartelt, RMA  DATE & TIME NOTIFIED: 11/14/2021 @ 12:35 pm   MESSENGER (representative from lab): [place]   MD NOTIFIED: Riki Sheer, DO   TIME OF NOTIFICATION: 12:38 pm  RESPONSE:

## 2021-11-14 NOTE — Telephone Encounter (Signed)
Referral done

## 2021-11-17 ENCOUNTER — Other Ambulatory Visit: Payer: Self-pay | Admitting: Family

## 2021-11-17 ENCOUNTER — Other Ambulatory Visit: Payer: BLUE CROSS/BLUE SHIELD

## 2021-11-17 DIAGNOSIS — D751 Secondary polycythemia: Secondary | ICD-10-CM

## 2021-11-18 ENCOUNTER — Encounter: Payer: Self-pay | Admitting: Family

## 2021-11-18 ENCOUNTER — Inpatient Hospital Stay: Payer: BLUE CROSS/BLUE SHIELD | Attending: Hematology & Oncology

## 2021-11-18 ENCOUNTER — Inpatient Hospital Stay: Payer: BLUE CROSS/BLUE SHIELD | Admitting: Family

## 2021-11-18 ENCOUNTER — Inpatient Hospital Stay: Payer: BLUE CROSS/BLUE SHIELD

## 2021-11-18 ENCOUNTER — Other Ambulatory Visit: Payer: Self-pay

## 2021-11-18 VITALS — BP 158/81 | HR 90 | Temp 98.3°F | Resp 18 | Ht 66.0 in | Wt 159.4 lb

## 2021-11-18 DIAGNOSIS — R5383 Other fatigue: Secondary | ICD-10-CM | POA: Insufficient documentation

## 2021-11-18 DIAGNOSIS — R972 Elevated prostate specific antigen [PSA]: Secondary | ICD-10-CM | POA: Diagnosis not present

## 2021-11-18 DIAGNOSIS — D751 Secondary polycythemia: Secondary | ICD-10-CM

## 2021-11-18 DIAGNOSIS — R0683 Snoring: Secondary | ICD-10-CM | POA: Insufficient documentation

## 2021-11-18 DIAGNOSIS — Z7982 Long term (current) use of aspirin: Secondary | ICD-10-CM | POA: Insufficient documentation

## 2021-11-18 DIAGNOSIS — Z87891 Personal history of nicotine dependence: Secondary | ICD-10-CM | POA: Insufficient documentation

## 2021-11-18 LAB — CBC WITH DIFFERENTIAL (CANCER CENTER ONLY)
Abs Immature Granulocytes: 0.03 10*3/uL (ref 0.00–0.07)
Basophils Absolute: 0 10*3/uL (ref 0.0–0.1)
Basophils Relative: 0 %
Eosinophils Absolute: 0 10*3/uL (ref 0.0–0.5)
Eosinophils Relative: 0 %
HCT: 51.6 % (ref 39.0–52.0)
Hemoglobin: 18.1 g/dL — ABNORMAL HIGH (ref 13.0–17.0)
Immature Granulocytes: 0 %
Lymphocytes Relative: 29 %
Lymphs Abs: 2.1 10*3/uL (ref 0.7–4.0)
MCH: 35.4 pg — ABNORMAL HIGH (ref 26.0–34.0)
MCHC: 35.1 g/dL (ref 30.0–36.0)
MCV: 101 fL — ABNORMAL HIGH (ref 80.0–100.0)
Monocytes Absolute: 0.7 10*3/uL (ref 0.1–1.0)
Monocytes Relative: 10 %
Neutro Abs: 4.2 10*3/uL (ref 1.7–7.7)
Neutrophils Relative %: 61 %
Platelet Count: 206 10*3/uL (ref 150–400)
RBC: 5.11 MIL/uL (ref 4.22–5.81)
RDW: 12.8 % (ref 11.5–15.5)
WBC Count: 7.1 10*3/uL (ref 4.0–10.5)
nRBC: 0 % (ref 0.0–0.2)

## 2021-11-18 LAB — CMP (CANCER CENTER ONLY)
ALT: 36 U/L (ref 0–44)
AST: 30 U/L (ref 15–41)
Albumin: 4.4 g/dL (ref 3.5–5.0)
Alkaline Phosphatase: 61 U/L (ref 38–126)
Anion gap: 8 (ref 5–15)
BUN: 10 mg/dL (ref 6–20)
CO2: 29 mmol/L (ref 22–32)
Calcium: 9.6 mg/dL (ref 8.9–10.3)
Chloride: 102 mmol/L (ref 98–111)
Creatinine: 0.98 mg/dL (ref 0.61–1.24)
GFR, Estimated: >60 mL/min (ref >60)
Glucose, Bld: 107 mg/dL — ABNORMAL HIGH (ref 70–99)
Potassium: 4.1 mmol/L (ref 3.5–5.1)
Sodium: 139 mmol/L (ref 135–145)
Total Bilirubin: 1.1 mg/dL (ref 0.3–1.2)
Total Protein: 7.7 g/dL (ref 6.5–8.1)

## 2021-11-18 LAB — IRON AND IRON BINDING CAPACITY (CC-WL,HP ONLY)
Iron: 298 ug/dL — ABNORMAL HIGH (ref 45–182)
Saturation Ratios: 99 % — ABNORMAL HIGH (ref 17.9–39.5)
TIBC: 301 ug/dL (ref 250–450)
UIBC: 3 ug/dL — ABNORMAL LOW (ref 117–376)

## 2021-11-18 LAB — RETICULOCYTES
Immature Retic Fract: 6.6 % (ref 2.3–15.9)
RBC.: 5.14 MIL/uL (ref 4.22–5.81)
Retic Count, Absolute: 110.5 10*3/uL (ref 19.0–186.0)
Retic Ct Pct: 2.2 % (ref 0.4–3.1)

## 2021-11-18 LAB — LACTATE DEHYDROGENASE: LDH: 159 U/L (ref 98–192)

## 2021-11-18 LAB — FERRITIN: Ferritin: 1185 ng/mL — ABNORMAL HIGH (ref 24–336)

## 2021-11-18 NOTE — Progress Notes (Addendum)
Hematology/Oncology Consultation   Name: Charles Hudson      MRN: [id number]    Location: Room/bed info not found  Date: 11/18/2021 Time:12:11 PM   REFERRING PHYSICIAN:  Hart Carwin P. Wendling, DO  REASON FOR CONSULT: Elevated Hgb    DIAGNOSIS: Erythrocytosis   HISTORY OF PRESENT ILLNESS: Charles Hudson is a very pleasant 61 yo caucasian gentleman with erythrocytosis noted over the last month with PCP.  Hgb today is 18.1, MCV 101, platelets 206 and WBC count is 7.1.  He is symptomatic with ruddy complexion as well as some intermittent fatigue.  He quit smoking 4 years ago.  He is not on any testosterone or work out supplements.  He does snore and wake up at night per his wife. We would recommend that he follow-up with PCP and request a sleep study. Sleep apnea could certainly be a contributing factor with erythrocytosis.  No known familial history of elevated red cell count.  Patient has no history of heart attack, stroke or liver disease.  He has not noted any blood loss. No abnormal bruising, no petechiae.  No personal or known familial history of cancer.  He has been referred to urology for an increasing PSA. Most recent PSA was 2.15.  No history of diabetes or thyroid disease.  No fever, chills, n/v, cough, rash, dizziness, visions changes or loss, headaches, SOB, chest pain, palpitations, abdominal pain or changes in bowel or bladder habits.  He has history of left knee surgery and multiple arthroscopies. He has also had pseudo gout. He states that with cold and rainy weather he will have pain and swelling in that knee.  No falls or syncope reported.  No recreational drug use. He usually has 4 beers a night and more on weekends.  Appetite and hydration have been good. Weight is stable at 159 lbs.  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   ROS: All other 10 point review of systems is negative.   PAST MEDICAL HISTORY:   Past  Medical History:  Diagnosis Date   Essential hypertension    Hyperlipidemia     ALLERGIES: No Known Allergies    MEDICATIONS:  Current Outpatient Medications on File Prior to Visit  Medication Sig Dispense Refill   amLODipine (NORVASC) 10 MG tablet Take 1 tablet by mouth once daily 90 tablet 0   No current facility-administered medications on file prior to visit.     PAST SURGICAL HISTORY Past Surgical History:  Procedure Laterality Date   KNEE ARTHROSCOPY  1991   KNEE ARTHROSCOPY     WISDOM TOOTH EXTRACTION      FAMILY HISTORY: Family History  Problem Relation Age of Onset   Alzheimer's disease Father    Colon cancer Neg Hx    Colon polyps Neg Hx    Esophageal cancer Neg Hx    Rectal cancer Neg Hx    Stomach cancer Neg Hx     SOCIAL HISTORY:  reports that he quit smoking about 3 years ago. His smoking use included cigarettes. He has a 45.00 pack-year smoking history. He has never used smokeless tobacco. He reports current alcohol use of about 28.0 standard drinks of alcohol per week. He reports that he does not use drugs.  PERFORMANCE STATUS: The patient's performance status is 1 - Symptomatic but completely ambulatory  PHYSICAL EXAM: Most Recent Vital Signs: Blood pressure (!) 158/81, pulse 90, temperature 98.3 F (36.8 C), temperature source Oral, resp. rate 18, height  $'5\' 6"'R$  (1.676 m), weight 159 lb 6.4 oz (72.3 kg), SpO2 100 %. BP (!) 158/81 (BP Location: Left Arm, Patient Position: Sitting)   Pulse 90   Temp 98.3 F (36.8 C) (Oral)   Resp 18   Ht '5\' 6"'$  (1.676 m)   Wt 159 lb 6.4 oz (72.3 kg)   SpO2 100%   BMI 25.73 kg/m   General Appearance:    Alert, cooperative, no distress, appears stated age  Head:    Normocephalic, without obvious abnormality, atraumatic  Eyes:    PERRL, conjunctiva/corneas clear, EOM's intact, fundi    benign, both eyes             Throat:   Lips, mucosa, and tongue normal; teeth and gums normal  Neck:   Supple, symmetrical,  trachea midline, no adenopathy;       thyroid:  No enlargement/tenderness/nodules; no carotid   bruit or JVD  Back:     Symmetric, no curvature, ROM normal, no CVA tenderness  Lungs:     Clear to auscultation bilaterally, respirations unlabored  Chest wall:    No tenderness or deformity  Heart:    Regular rate and rhythm, S1 and S2 normal, no murmur, rub   or gallop  Abdomen:     Soft, non-tender, bowel sounds active all four quadrants,    no masses, no organomegaly        Extremities:   Extremities normal, atraumatic, no cyanosis or edema  Pulses:   2+ and symmetric all extremities  Skin:   Skin color, texture, turgor normal, no rashes or lesions  Lymph nodes:   Cervical, supraclavicular, and axillary nodes normal  Neurologic:   CNII-XII intact. Normal strength, sensation and reflexes      throughout    LABORATORY DATA:  Results for orders placed or performed in visit on 11/18/21 (from the past 48 hour(s))  CBC with Differential ([place] Only)     Status: Abnormal   Collection Time: 11/18/21 10:29 AM  Result Value Ref Range   WBC Count 7.1 4.0 - 10.5 K/uL   RBC 5.11 4.22 - 5.81 MIL/uL   Hemoglobin 18.1 (H) 13.0 - 17.0 g/dL   HCT 51.6 39.0 - 52.0 %   MCV 101.0 (H) 80.0 - 100.0 fL   MCH 35.4 (H) 26.0 - 34.0 pg   MCHC 35.1 30.0 - 36.0 g/dL   RDW 12.8 11.5 - 15.5 %   Platelet Count 206 150 - 400 K/uL   nRBC 0.0 0.0 - 0.2 %   Neutrophils Relative % 61 %   Neutro Abs 4.2 1.7 - 7.7 K/uL   Lymphocytes Relative 29 %   Lymphs Abs 2.1 0.7 - 4.0 K/uL   Monocytes Relative 10 %   Monocytes Absolute 0.7 0.1 - 1.0 K/uL   Eosinophils Relative 0 %   Eosinophils Absolute 0.0 0.0 - 0.5 K/uL   Basophils Relative 0 %   Basophils Absolute 0.0 0.0 - 0.1 K/uL   Immature Granulocytes 0 %   Abs Immature Granulocytes 0.03 0.00 - 0.07 K/uL    Comment: Performed at [facility] at [facility], [address] [place], [place] [address]  CMP (Mobile only)      Status: Abnormal   Collection Time: 11/18/21 10:29 AM  Result Value Ref Range   Sodium 139 135 - 145 mmol/L   Potassium 4.1 3.5 - 5.1 mmol/L   Chloride 102 98 - 111 mmol/L   CO2 29  22 - 32 mmol/L   Glucose, Bld 107 (H) 70 - 99 mg/dL    Comment: Glucose reference range applies only to samples taken after fasting for at least 8 hours.   BUN 10 6 - 20 mg/dL   Creatinine 0.98 0.61 - 1.24 mg/dL   Calcium 9.6 8.9 - 10.3 mg/dL   Total Protein 7.7 6.5 - 8.1 g/dL   Albumin 4.4 3.5 - 5.0 g/dL   AST 30 15 - 41 U/L   ALT 36 0 - 44 U/L   Alkaline Phosphatase 61 38 - 126 U/L   Total Bilirubin 1.1 0.3 - 1.2 mg/dL   GFR, Estimated >60 >60 mL/min    Comment: (NOTE) Calculated using the CKD-EPI Creatinine Equation (2021)    Anion gap 8 5 - 15    Comment: Performed at [facility] at [facility], [address] [place], [place] [address]  Reticulocytes     Status: None   Collection Time: 11/18/21 10:29 AM  Result Value Ref Range   Retic Ct Pct 2.2 0.4 - 3.1 %   RBC. 5.14 4.22 - 5.81 MIL/uL   Retic Count, Absolute 110.5 19.0 - 186.0 K/uL   Immature Retic Fract 6.6 2.3 - 15.9 %    Comment: Performed at [facility] Lab at [facility], [address] [place], [place] [address]  Lactate dehydrogenase (LDH)     Status: None   Collection Time: 11/18/21 10:30 AM  Result Value Ref Range   LDH 159 98 - 192 U/L    Comment: Performed at [facility] at [facility], [address], [place], [place] [address]      RADIOGRAPHY: No results found.     PATHOLOGY: None  ASSESSMENT/PLAN: Mr. Farrington is a very pleasant 61 yo caucasian gentleman with erythrocytosis noted over the last month with PCP.  Lab work up pending. If iron studies elevated we will add a hemochromatosis DNA.  He will start taking 1 coated baby aspirin daily with food.  We will hold off on phlebotomizing him today until we have his lab results.  We do recommend  that his PCP get him set up with a sleep study to r/o sleep apnea.  Follow-up pending result.   All questions were answered. The patient knows to call the clinic with any problems, questions or concerns. We can certainly see the patient much sooner if necessary.  The patient was discussed with Dr. Marin Olp and he is in agreement with the aforementioned.   Lottie Dawson, NP

## 2021-11-19 ENCOUNTER — Other Ambulatory Visit: Payer: Self-pay | Admitting: Family

## 2021-11-19 LAB — ERYTHROPOIETIN: Erythropoietin: 8 m[IU]/mL (ref 2.6–18.5)

## 2021-11-20 ENCOUNTER — Other Ambulatory Visit: Payer: Self-pay

## 2021-11-20 DIAGNOSIS — D751 Secondary polycythemia: Secondary | ICD-10-CM

## 2021-11-24 ENCOUNTER — Other Ambulatory Visit: Payer: Self-pay

## 2021-11-24 DIAGNOSIS — Z122 Encounter for screening for malignant neoplasm of respiratory organs: Secondary | ICD-10-CM

## 2021-11-24 DIAGNOSIS — Z87891 Personal history of nicotine dependence: Secondary | ICD-10-CM

## 2021-11-25 LAB — HEMOCHROMATOSIS DNA-PCR(C282Y,H63D)

## 2021-11-25 LAB — JAK2 (INCLUDING V617F AND EXON 12), MPL,& CALR-NEXT GEN SEQ

## 2021-11-26 ENCOUNTER — Telehealth: Payer: Self-pay | Admitting: Family

## 2021-11-26 NOTE — Telephone Encounter (Signed)
I called the patient's wife per his request to go over his recent labs and treatment plan. His JAK 2 and hemochromatosis DNA tests were negative. His iron studies are quite high with saturation 99% and ferritin > 1,100. I reviewed with Dr. Marin Olp and it is felt that he has an underlying form of hemochromatosis. He is taking a baby aspirin daily.  We will get him set up for weekly phlebotomy for 3 weeks. Follow-up in 4 weeks. No questions or concerns at this time. Wife appreciative of call.

## 2021-11-28 ENCOUNTER — Other Ambulatory Visit: Payer: Self-pay | Admitting: Family

## 2021-11-28 ENCOUNTER — Inpatient Hospital Stay (HOSPITAL_BASED_OUTPATIENT_CLINIC_OR_DEPARTMENT_OTHER): Payer: BLUE CROSS/BLUE SHIELD

## 2021-11-28 DIAGNOSIS — D751 Secondary polycythemia: Secondary | ICD-10-CM | POA: Diagnosis not present

## 2021-11-28 NOTE — Progress Notes (Signed)
Charles Hudson presents today for phlebotomy per MD orders. Phlebotomy procedure started at 1343 and ended at 1349. 521 cc removed via 16 G needle at [facility] site. Patient tolerated procedure well.

## 2021-11-28 NOTE — Patient Instructions (Signed)

## 2021-11-29 ENCOUNTER — Other Ambulatory Visit: Payer: Self-pay | Admitting: Family Medicine

## 2021-12-05 ENCOUNTER — Inpatient Hospital Stay: Payer: BLUE CROSS/BLUE SHIELD | Attending: Hematology & Oncology

## 2021-12-05 DIAGNOSIS — Z87891 Personal history of nicotine dependence: Secondary | ICD-10-CM | POA: Insufficient documentation

## 2021-12-05 DIAGNOSIS — R5383 Other fatigue: Secondary | ICD-10-CM | POA: Insufficient documentation

## 2021-12-05 DIAGNOSIS — D751 Secondary polycythemia: Secondary | ICD-10-CM | POA: Diagnosis present

## 2021-12-05 NOTE — Progress Notes (Signed)
Patient arrived to infusion room for a therapeutic phlebotomy. 18G needle placed to right AC and phlebotomized 500 mls of blood. Phlebotomy started at 15:30 pm and ended at 15:37pm. Patient tolerated well. He stayed post phlebotomy and had snacks and drinks.

## 2021-12-05 NOTE — Patient Instructions (Signed)

## 2021-12-12 ENCOUNTER — Inpatient Hospital Stay: Payer: BLUE CROSS/BLUE SHIELD

## 2021-12-12 DIAGNOSIS — D751 Secondary polycythemia: Secondary | ICD-10-CM | POA: Diagnosis not present

## 2021-12-12 NOTE — Progress Notes (Signed)
1 unit phlebotomy performed over 7 minutes using a 16 gauge phlebotomy set to the right AC. Patient tolerated well. Nourishment provided.

## 2021-12-12 NOTE — Patient Instructions (Signed)

## 2021-12-24 ENCOUNTER — Encounter: Payer: Self-pay | Admitting: Acute Care

## 2021-12-24 ENCOUNTER — Ambulatory Visit (INDEPENDENT_AMBULATORY_CARE_PROVIDER_SITE_OTHER): Payer: BLUE CROSS/BLUE SHIELD | Admitting: Acute Care

## 2021-12-24 DIAGNOSIS — Z87891 Personal history of nicotine dependence: Secondary | ICD-10-CM

## 2021-12-24 NOTE — Progress Notes (Signed)
Virtual Visit via Telephone Note  I connected with Charles Hudson on 12/24/21 at  9:30 AM EDT by telephone and verified that I am speaking with the correct person using two identifiers.  Location: Patient:  At home Provider:  [place], [place], [place], [address]    I discussed the limitations, risks, security and privacy concerns of performing an evaluation and management service by telephone and the availability of in person appointments. I also discussed with the patient that there may be a patient responsible charge related to this service. The patient expressed understanding and agreed to proceed.   Shared Decision Making Visit Lung Cancer Screening Program [phone])   Eligibility: Age 61 y.o. Pack Years Smoking History Calculation 74 pack year smoking history (# packs/per year x # years smoked) Recent History of coughing up blood  no Unexplained weight loss? no ( >Than 15 pounds within the last 6 months ) Prior History Lung / other cancer no (Diagnosis within the last 5 years already requiring surveillance chest CT Scans). Smoking Status Former Smoker Former Smokers: Years since quit: 4 years  Quit Date: 2019  Visit Components: Discussion included one or more decision making aids. yes Discussion included risk/benefits of screening. yes Discussion included potential follow up diagnostic testing for abnormal scans. yes Discussion included meaning and risk of over diagnosis. yes Discussion included meaning and risk of False Positives. yes Discussion included meaning of total radiation exposure. yes  Counseling Included: Importance of adherence to annual lung cancer LDCT screening. yes Impact of comorbidities on ability to participate in the program. yes Ability and willingness to under diagnostic treatment. yes  Smoking Cessation Counseling: Current Smokers:  Discussed importance of smoking cessation. yes Information about tobacco cessation classes and  interventions provided to patient. yes Patient provided with "ticket" for LDCT Scan. yes Symptomatic Patient. no  Counseling NA Diagnosis Code: Tobacco Use Z72.0 Asymptomatic Patient yes  Counseling (Intermediate counseling: > three minutes counseling) V4259 Former Smokers:  Discussed the importance of maintaining cigarette abstinence. yes Diagnosis Code: Personal History of Nicotine Dependence. D63.875 Information about tobacco cessation classes and interventions provided to patient. Yes Patient provided with "ticket" for LDCT Scan. yes Written Order for Lung Cancer Screening with LDCT placed in Epic. Yes (CT Chest Lung Cancer Screening Low Dose W/O CM) [id number] Z12.2-Screening of respiratory organs Z87.891-Personal history of nicotine dependence  I have spent 25 minutes of face to face/ virtual visit   time with Mr. Rohl discussing the risks and benefits of lung cancer screening. We viewed / discussed a power point together that explained in detail the above noted topics. We paused at intervals to allow for questions to be asked and answered to ensure understanding.We discussed that the single most powerful action that he can take to decrease his risk of developing lung cancer is to quit smoking. We discussed whether or not he is ready to commit to setting a quit date. We discussed options for tools to aid in quitting smoking including nicotine replacement therapy, non-nicotine medications, support groups, Quit Smart classes, and behavior modification. We discussed that often times setting smaller, more achievable goals, such as eliminating 1 cigarette a day for a week and then 2 cigarettes a day for a week can be helpful in slowly decreasing the number of cigarettes smoked. This allows for a sense of accomplishment as well as providing a clinical benefit. I provided  him  with smoking cessation  information  with contact information for community resources, classes, free nicotine  replacement  therapy, and access to mobile apps, text messaging, and on-line smoking cessation help. I have also provided  him  the office contact information in the event he needs to contact me, or the screening staff. We discussed the time and location of the scan, and that either Doroteo Glassman RN, Joella Prince, RN  or I will call / send a letter with the results within 24-72 hours of receiving them. The patient verbalized understanding of all of  the above and had no further questions upon leaving the office. They have my contact information in the event they have any further questions.  I spent 3 minutes counseling on smoking cessation and the health risks of continued tobacco abuse.  I explained to the patient that there has been a high incidence of coronary artery disease noted on these exams. I explained that this is a non-gated exam therefore degree or severity cannot be determined. This patient is not on statin therapy. I have asked the patient to follow-up with their PCP regarding any incidental finding of coronary artery disease and management with diet or medication as their PCP  feels is clinically indicated. The patient verbalized understanding of the above and had no further questions upon completion of the visit.     Magdalen Spatz, NP 12/24/2021

## 2021-12-24 NOTE — Patient Instructions (Signed)
Thank you for participating in the [place] Lung Cancer Screening Program. It was our pleasure to meet you today. We will call you with the results of your scan within the next few days. Your scan will be assigned a Lung RADS category score by the physicians reading the scans.  This Lung RADS score determines follow up scanning.  See below for description of categories, and follow up screening recommendations. We will be in touch to schedule your follow up screening annually or based on recommendations of our providers. We will fax a copy of your scan results to your Primary Care Physician, or the physician who referred you to the program, to ensure they have the results. Please call the office if you have any questions or concerns regarding your scanning experience or results.  Our office number is [phone]. Please speak with Denise Phelps, RN. , or  Denise Buckner RN, They are  our Lung Cancer Screening RN.'s If They are unavailable when you call, Please leave a message on the voice mail. We will return your call at our earliest convenience.This voice mail is monitored several times a day.  Remember, if your scan is normal, we will scan you annually as long as you continue to meet the criteria for the program. (Age 55-77, Current smoker or smoker who has quit within the last 15 years). If you are a smoker, remember, quitting is the single most powerful action that you can take to decrease your risk of lung cancer and other pulmonary, breathing related problems. We know quitting is hard, and we are here to help.  Please let us know if there is anything we can do to help you meet your goal of quitting. If you are a former smoker, congratulations. We are proud of you! Remain smoke free! Remember you can refer friends or family members through the number above.  We will screen them to make sure they meet criteria for the program. Thank you for helping us take better care of you by  participating in Lung Screening.  You can receive free nicotine replacement therapy ( patches, gum or mints) by calling 1-800-QUIT NOW. Please call so we can get you on the path to becoming  a non-smoker. I know it is hard, but you can do this!  Lung RADS Categories:  Lung RADS 1: no nodules or definitely non-concerning nodules.  Recommendation is for a repeat annual scan in 12 months.  Lung RADS 2:  nodules that are non-concerning in appearance and behavior with a very low likelihood of becoming an active cancer. Recommendation is for a repeat annual scan in 12 months.  Lung RADS 3: nodules that are probably non-concerning , includes nodules with a low likelihood of becoming an active cancer.  Recommendation is for a 6-month repeat screening scan. Often noted after an upper respiratory illness. We will be in touch to make sure you have no questions, and to schedule your 6-month scan.  Lung RADS 4 A: nodules with concerning findings, recommendation is most often for a follow up scan in 3 months or additional testing based on our provider's assessment of the scan. We will be in touch to make sure you have no questions and to schedule the recommended 3 month follow up scan.  Lung RADS 4 B:  indicates findings that are concerning. We will be in touch with you to schedule additional diagnostic testing based on our provider's  assessment of the scan.  Other options for assistance in smoking cessation (   As covered by your insurance benefits)  Hypnosis for smoking cessation  [employer] [phone]  Acupuncture for smoking cessation  [facility] [phone]   

## 2021-12-26 ENCOUNTER — Inpatient Hospital Stay: Payer: BLUE CROSS/BLUE SHIELD

## 2021-12-26 ENCOUNTER — Other Ambulatory Visit: Payer: Self-pay | Admitting: Family

## 2021-12-26 ENCOUNTER — Inpatient Hospital Stay: Payer: BLUE CROSS/BLUE SHIELD | Admitting: Family

## 2021-12-26 ENCOUNTER — Ambulatory Visit (HOSPITAL_BASED_OUTPATIENT_CLINIC_OR_DEPARTMENT_OTHER)
Admission: RE | Admit: 2021-12-26 | Discharge: 2021-12-26 | Disposition: A | Discharge status: Home / Self Care | Payer: BLUE CROSS/BLUE SHIELD | Source: Ambulatory Visit | Attending: Family Medicine | Admitting: Family Medicine

## 2021-12-26 DIAGNOSIS — D751 Secondary polycythemia: Secondary | ICD-10-CM | POA: Diagnosis not present

## 2021-12-26 DIAGNOSIS — Z87891 Personal history of nicotine dependence: Secondary | ICD-10-CM

## 2021-12-26 DIAGNOSIS — Z122 Encounter for screening for malignant neoplasm of respiratory organs: Secondary | ICD-10-CM | POA: Insufficient documentation

## 2021-12-26 LAB — CBC WITH DIFFERENTIAL (CANCER CENTER ONLY)
Abs Immature Granulocytes: 0.02 10*3/uL (ref 0.00–0.07)
Basophils Absolute: 0 10*3/uL (ref 0.0–0.1)
Basophils Relative: 1 %
Eosinophils Absolute: 0.1 10*3/uL (ref 0.0–0.5)
Eosinophils Relative: 1 %
HCT: 43.7 % (ref 39.0–52.0)
Hemoglobin: 14.9 g/dL (ref 13.0–17.0)
Immature Granulocytes: 0 %
Lymphocytes Relative: 29 %
Lymphs Abs: 2.3 10*3/uL (ref 0.7–4.0)
MCH: 35.6 pg — ABNORMAL HIGH (ref 26.0–34.0)
MCHC: 34.1 g/dL (ref 30.0–36.0)
MCV: 104.3 fL — ABNORMAL HIGH (ref 80.0–100.0)
Monocytes Absolute: 0.7 10*3/uL (ref 0.1–1.0)
Monocytes Relative: 8 %
Neutro Abs: 4.8 10*3/uL (ref 1.7–7.7)
Neutrophils Relative %: 61 %
Platelet Count: 236 10*3/uL (ref 150–400)
RBC: 4.19 MIL/uL — ABNORMAL LOW (ref 4.22–5.81)
RDW: 13.2 % (ref 11.5–15.5)
WBC Count: 7.9 10*3/uL (ref 4.0–10.5)
nRBC: 0 % (ref 0.0–0.2)

## 2021-12-26 LAB — CMP (CANCER CENTER ONLY)
ALT: 37 U/L (ref 0–44)
AST: 30 U/L (ref 15–41)
Albumin: 4.1 g/dL (ref 3.5–5.0)
Alkaline Phosphatase: 74 U/L (ref 38–126)
Anion gap: 7 (ref 5–15)
BUN: 8 mg/dL (ref 6–20)
CO2: 30 mmol/L (ref 22–32)
Calcium: 9.1 mg/dL (ref 8.9–10.3)
Chloride: 103 mmol/L (ref 98–111)
Creatinine: 1 mg/dL (ref 0.61–1.24)
GFR, Estimated: >60 mL/min (ref >60)
Glucose, Bld: 129 mg/dL — ABNORMAL HIGH (ref 70–99)
Potassium: 4 mmol/L (ref 3.5–5.1)
Sodium: 140 mmol/L (ref 135–145)
Total Bilirubin: 0.9 mg/dL (ref 0.3–1.2)
Total Protein: 6.8 g/dL (ref 6.5–8.1)

## 2021-12-26 LAB — FERRITIN: Ferritin: 1091 ng/mL — ABNORMAL HIGH (ref 24–336)

## 2021-12-26 NOTE — Progress Notes (Signed)
Hematology and Oncology Follow Up Visit  Maxon Kresse [id number] [birth year] 61 y.o. 12/26/2021   Principle Diagnosis:  Hemochromatosis negative but has iron overload JAK-2 negative  Current Therapy:   Phlebotomy to maintain ferritin < 100 and iron saturation < 50%   Interim History:  Mr. Bones is here today for follow-up. He is doing well and has no complaints at this time.  He has occasional fatigue. Complexion is less ruddy this visit.  Hgb down to 14.9, MCV 104, platelets 236 and WBC count 7.9.  No fever, chills, n/v, cough, rash, dizziness, SOB, chest pain, palpitations, abdominal pain or changes in bowel or bladder habits.  No swelling, tenderness, numbness or tingling in his extremities.  No falls or syncope.  Appetite and hydration are good. Weight is stable at 158 lbs.   ECOG Performance Status: 1 - Symptomatic but completely ambulatory  Medications:  Allergies as of 12/26/2021   No Known Allergies      Medication List        Accurate as of December 26, 2021  2:01 PM. If you have any questions, ask your nurse or doctor.          amLODipine 10 MG tablet Commonly known as: NORVASC Take 1 tablet by mouth once daily        Allergies: No Known Allergies  Past Medical History, Surgical history, Social history, and Family History were reviewed and updated.  Review of Systems: All other 10 point review of systems is negative.   Physical Exam:  vitals were not taken for this visit.   Wt Readings from Last 3 Encounters:  12/26/21 160 lb (72.6 kg)  11/18/21 159 lb 6.4 oz (72.3 kg)  10/20/21 160 lb (72.6 kg)    Ocular: Sclerae unicteric, pupils equal, round and reactive to light Ear-nose-throat: Oropharynx clear, dentition fair Lymphatic: No cervical or supraclavicular adenopathy Lungs no rales or rhonchi, good excursion bilaterally Heart regular rate and rhythm, no murmur appreciated Abd soft, nontender, positive bowel sounds MSK no focal spinal  tenderness, no joint edema Neuro: non-focal, well-oriented, appropriate affect Breasts: Deferred   Lab Results  Component Value Date   WBC 7.9 12/26/2021   HGB 14.9 12/26/2021   HCT 43.7 12/26/2021   MCV 104.3 (H) 12/26/2021   PLT 236 12/26/2021   Lab Results  Component Value Date   FERRITIN 1,185 (H) 11/18/2021   IRON 298 (H) 11/18/2021   TIBC 301 11/18/2021   UIBC 3 (L) 11/18/2021   IRONPCTSAT 99 (H) 11/18/2021   Lab Results  Component Value Date   RETICCTPCT 2.2 11/18/2021   RBC 4.19 (L) 12/26/2021   No results found for: "KPAFRELGTCHN", "LAMBDASER", "KAPLAMBRATIO" No results found for: "IGGSERUM", "IGA", "IGMSERUM" No results found for: "TOTALPROTELP", "ALBUMINELP", "A1GS", "A2GS", "BETS", "BETA2SER", "GAMS", "MSPIKE", "SPEI"   Chemistry      Component Value Date/Time   NA 139 11/18/2021 1029   K 4.1 11/18/2021 1029   CL 102 11/18/2021 1029   CO2 29 11/18/2021 1029   BUN 10 11/18/2021 1029   CREATININE 0.98 11/18/2021 1029      Component Value Date/Time   CALCIUM 9.6 11/18/2021 1029   ALKPHOS 61 11/18/2021 1029   AST 30 11/18/2021 1029   ALT 36 11/18/2021 1029   BILITOT 1.1 11/18/2021 1029       Impression and Plan: Mr. Eichhorst is a very pleasant 61 yo caucasian gentleman with erythrocytosis and iron overload. His JAK 2 and hemochromatosis DNA were both negative. I had discussed with  Dr. Marin Olp and it is felt that he does have some sort of hemochromatosis.  Iron studies are pending. We will set him up for phlebotomy if needed.  Follow-up in 8 weeks.   Lottie Dawson, NP 10/27/20232:01 PM

## 2021-12-29 LAB — IRON AND IRON BINDING CAPACITY (CC-WL,HP ONLY)
Iron: 287 ug/dL — ABNORMAL HIGH (ref 45–182)
Saturation Ratios: 97 % — ABNORMAL HIGH (ref 17.9–39.5)
TIBC: 297 ug/dL (ref 250–450)
UIBC: 10 ug/dL — ABNORMAL LOW (ref 117–376)

## 2021-12-30 ENCOUNTER — Other Ambulatory Visit: Payer: Self-pay

## 2021-12-30 DIAGNOSIS — Z122 Encounter for screening for malignant neoplasm of respiratory organs: Secondary | ICD-10-CM

## 2021-12-30 DIAGNOSIS — Z87891 Personal history of nicotine dependence: Secondary | ICD-10-CM

## 2022-01-05 ENCOUNTER — Telehealth: Payer: Self-pay | Admitting: Family Medicine

## 2022-01-05 NOTE — Telephone Encounter (Signed)
Called the patient informed of PCP response. She just said that they were not happy with the lack of communication regarding his results/lack of getting a good explanation of results/also they have not been explained a treatment plan. She would like to know how long the treatment plan will last? They do not want to change doctors, but just have their concerns They have just had a lack of communication.

## 2022-01-05 NOTE — Telephone Encounter (Signed)
Patient's wife called stating she is not content with the care her husband is receiving in the [facility]. She stated that they both feel like they have not explained the treatment he will be going to have and they would like for Dr. Nani Ravens to look over his plan of care and see what he recommends. She would like a call back to further discuss. Please advise.

## 2022-01-06 NOTE — Telephone Encounter (Signed)
It is unlikely he will go anemic considering where he started. 14 is a good place to be. Would rec reaching out to the hematology team if she has further concerns as they could answer some of these questions better than me.

## 2022-01-06 NOTE — Telephone Encounter (Signed)
Spoke to his wife informed of PCP response. She is concerned that his hemoglobin dropped from 18 to 14, she is so very concerned it will continue get lower. She apologized for continuing to ask questions but they are just not getting any explanations about his results/or anything from the [facility]. She is very concerned he is going to become anemic? And would like your thoughts please.

## 2022-01-06 NOTE — Telephone Encounter (Signed)
They will likely monitor him lifelong at this point. He may need phlebotomy less or more frequently depending on his blood levels though.

## 2022-01-06 NOTE — Telephone Encounter (Signed)
Called informed the patients wife of PCP's response. She verbalized understanding. She stated they would go to appt. On Friday and see how things go.

## 2022-01-09 ENCOUNTER — Inpatient Hospital Stay: Payer: BLUE CROSS/BLUE SHIELD | Attending: Hematology & Oncology

## 2022-01-09 DIAGNOSIS — D751 Secondary polycythemia: Secondary | ICD-10-CM | POA: Diagnosis not present

## 2022-01-09 NOTE — Patient Instructions (Signed)

## 2022-01-09 NOTE — Progress Notes (Signed)
Charles Hudson presents today for phlebotomy per MD orders. Phlebotomy procedure started at 1105 and ended at 1115 530 cc removed. Patient tolerated procedure well. IV needle removed intact.

## 2022-01-12 ENCOUNTER — Institutional Professional Consult (permissible substitution): Payer: BLUE CROSS/BLUE SHIELD | Admitting: Neurology

## 2022-01-12 ENCOUNTER — Telehealth: Payer: Self-pay | Admitting: *Deleted

## 2022-01-12 NOTE — Telephone Encounter (Signed)
This nurse called patient's wife Charles Hudson) and answered all her questions regarding phlebotomies, labs associated with phlebotomies, how to interpret labs, scheduling, treatment plan, length of treatment plan, foods that are rich in iron he needs to avoid (Hemochromatosis). He is coming this Friday for another phlebotomy. I told her that I would come and talk with them if they had any further concerns or questions. She verbalized understanding.

## 2022-01-16 ENCOUNTER — Inpatient Hospital Stay: Payer: BLUE CROSS/BLUE SHIELD

## 2022-01-16 DIAGNOSIS — D751 Secondary polycythemia: Secondary | ICD-10-CM | POA: Diagnosis not present

## 2022-01-16 NOTE — Progress Notes (Signed)
1 unit phlebotomy performed over 5 minutes using a 16 gauge phlebotomy set to the right AC. Patient tolerated well. Nourishment provided.

## 2022-01-16 NOTE — Patient Instructions (Signed)

## 2022-01-30 ENCOUNTER — Inpatient Hospital Stay: Payer: BLUE CROSS/BLUE SHIELD | Attending: Hematology & Oncology

## 2022-01-30 DIAGNOSIS — D751 Secondary polycythemia: Secondary | ICD-10-CM | POA: Diagnosis not present

## 2022-01-30 NOTE — Patient Instructions (Signed)

## 2022-01-30 NOTE — Progress Notes (Signed)
At 11:28 am I placed a 16G needle to patient's right AC. Phlebotomized 500 mls of blood. Patient tolerated well. He stayed for snacks and drinks afterwards. VSS at discharge.

## 2022-02-03 ENCOUNTER — Institutional Professional Consult (permissible substitution): Payer: BLUE CROSS/BLUE SHIELD | Admitting: Neurology

## 2022-02-06 ENCOUNTER — Inpatient Hospital Stay: Payer: BLUE CROSS/BLUE SHIELD

## 2022-02-06 DIAGNOSIS — D751 Secondary polycythemia: Secondary | ICD-10-CM | POA: Diagnosis not present

## 2022-02-06 LAB — FERRITIN: Ferritin: 1072 ng/mL — ABNORMAL HIGH (ref 24–336)

## 2022-02-06 LAB — CBC WITH DIFFERENTIAL (CANCER CENTER ONLY)
Abs Immature Granulocytes: 0.02 10*3/uL (ref 0.00–0.07)
Basophils Absolute: 0.1 10*3/uL (ref 0.0–0.1)
Basophils Relative: 1 %
Eosinophils Absolute: 0.1 10*3/uL (ref 0.0–0.5)
Eosinophils Relative: 1 %
HCT: 45.2 % (ref 39.0–52.0)
Hemoglobin: 15.3 g/dL (ref 13.0–17.0)
Immature Granulocytes: 0 %
Lymphocytes Relative: 29 %
Lymphs Abs: 2.2 10*3/uL (ref 0.7–4.0)
MCH: 36.5 pg — ABNORMAL HIGH (ref 26.0–34.0)
MCHC: 33.8 g/dL (ref 30.0–36.0)
MCV: 107.9 fL — ABNORMAL HIGH (ref 80.0–100.0)
Monocytes Absolute: 0.7 10*3/uL (ref 0.1–1.0)
Monocytes Relative: 9 %
Neutro Abs: 4.5 10*3/uL (ref 1.7–7.7)
Neutrophils Relative %: 60 %
Platelet Count: 268 10*3/uL (ref 150–400)
RBC: 4.19 MIL/uL — ABNORMAL LOW (ref 4.22–5.81)
RDW: 13 % (ref 11.5–15.5)
WBC Count: 7.6 10*3/uL (ref 4.0–10.5)
nRBC: 0 % (ref 0.0–0.2)

## 2022-02-06 LAB — CMP (CANCER CENTER ONLY)
ALT: 52 U/L — ABNORMAL HIGH (ref 0–44)
AST: 41 U/L (ref 15–41)
Albumin: 4.3 g/dL (ref 3.5–5.0)
Alkaline Phosphatase: 83 U/L (ref 38–126)
Anion gap: 8 (ref 5–15)
BUN: 9 mg/dL (ref 6–20)
CO2: 30 mmol/L (ref 22–32)
Calcium: 9.5 mg/dL (ref 8.9–10.3)
Chloride: 103 mmol/L (ref 98–111)
Creatinine: 0.96 mg/dL (ref 0.61–1.24)
GFR, Estimated: >60 mL/min (ref >60)
Glucose, Bld: 104 mg/dL — ABNORMAL HIGH (ref 70–99)
Potassium: 4.1 mmol/L (ref 3.5–5.1)
Sodium: 141 mmol/L (ref 135–145)
Total Bilirubin: 0.9 mg/dL (ref 0.3–1.2)
Total Protein: 7.3 g/dL (ref 6.5–8.1)

## 2022-02-06 LAB — IRON AND IRON BINDING CAPACITY (CC-WL,HP ONLY)
Iron: 198 ug/dL — ABNORMAL HIGH (ref 45–182)
Saturation Ratios: 60 % — ABNORMAL HIGH (ref 17.9–39.5)
TIBC: 330 ug/dL (ref 250–450)
UIBC: 132 ug/dL (ref 117–376)

## 2022-02-13 ENCOUNTER — Encounter: Payer: Self-pay | Admitting: Family Medicine

## 2022-02-13 ENCOUNTER — Ambulatory Visit: Payer: BLUE CROSS/BLUE SHIELD | Admitting: Family Medicine

## 2022-02-13 VITALS — BP 134/64 | HR 73 | Temp 97.6°F | Ht 66.0 in | Wt 158.1 lb

## 2022-02-13 DIAGNOSIS — M65312 Trigger thumb, left thumb: Secondary | ICD-10-CM

## 2022-02-13 DIAGNOSIS — M65311 Trigger thumb, right thumb: Secondary | ICD-10-CM | POA: Diagnosis not present

## 2022-02-13 MED ORDER — ATORVASTATIN CALCIUM 20 MG PO TABS: 20.0000 mg | ORAL_TABLET | Freq: Every day | ORAL | 3 refills | Status: DC

## 2022-02-13 MED ORDER — METHYLPREDNISOLONE ACETATE 40 MG/ML IJ SUSP
20.0000 mg | Freq: Once | INTRAMUSCULAR | Status: AC
  Administered 2022-02-13: 20 mg via INTRA_ARTICULAR

## 2022-02-13 NOTE — Patient Instructions (Addendum)
Ice/cold pack over area for 10-15 min twice daily.  OK to take Tylenol 1000 mg (2 extra strength tabs) or 975 mg (3 regular strength tabs) every 6 hours as needed.  Keep the diet clean and stay active.  Stay hydrated.   Let us know if you need anything.

## 2022-02-13 NOTE — Progress Notes (Signed)
Musculoskeletal Exam  Patient: Charles Hudson DOB: October 28, 1960  DOS: 02/13/2022  SUBJECTIVE:  Chief Complaint:   Chief Complaint  Patient presents with   Hand Pain    Both thumbs pain    Charles Hudson is a 61 y.o.  male for evaluation and treatment of bilateral pain.   Onset:  2 weeks ago. No inj or change in activity.  Location: base of thumbs Character:  aching  Progression of issue:  is unchanged Associated symptoms: some locking Treatment: to date has been rest and ice.   Neurovascular symptoms: no  Past Medical History:  Diagnosis Date   Essential hypertension    Hyperlipidemia     Objective: VITAL SIGNS: BP 134/64 (BP Location: Left Arm, Cuff Size: Normal)   Pulse 73   Temp 97.6 F (36.4 C) (Oral)   Ht '5\' 6"'$  (1.676 m)   Wt 158 lb 2 oz (71.7 kg)   SpO2 97%   BMI 25.52 kg/m  Constitutional: Well formed, well developed. No acute distress. Thorax & Lungs: No accessory muscle use Musculoskeletal: Thumbs.   Normal active range of motion: yes.   Normal passive range of motion: yes Tenderness to palpation: yes, over the palmar surface of 1st digit/MC Deformity: no Ecchymosis: no Neurologic: Normal sensory function. Psychiatric: Normal mood. Age appropriate judgment and insight. Alert & oriented x 3.    Procedure note: Trigger finger injection-both thumbs Verbal consent obtained. The area of interest was palpated and just distal over the palmar MCP crease, the tendon was marked with an otoscope speculum. The area marked by a speculum was cleaned with alcohol and freeze spray was used for topical anesthesia. A 27-gauge needle was inserted at a 45 degree angle and once under the skin, continued in parallel fashion to the tendon, and used to inject 20 mg of Depo-Medrol and 0.5 mg of 1% lidocaine without epinephrine.  The plunger of the syringe was withdrawn prior to injecting the medication to ensure the needle was not in a blood vessel. A Band-Aid was placed. This  process was repeated on the contralateral side. The patient tolerated the procedure well with no immediate complications noted.  Assessment:  Trigger thumb of both thumbs - Plan: methylPREDNISolone acetate (DEPO-MEDROL) injection 20 mg, methylPREDNISolone acetate (DEPO-MEDROL) injection 20 mg, PR INJECT TENDON SHEATH/LIGAMENT  Plan: Injections today as he did well previously, heat, ice, Tylenol.  F/u as originally scheduled. The patient voiced understanding and agreement to the plan.   [place], DO 02/13/22  10:39 AM

## 2022-02-20 ENCOUNTER — Inpatient Hospital Stay: Payer: BLUE CROSS/BLUE SHIELD

## 2022-02-20 DIAGNOSIS — D751 Secondary polycythemia: Secondary | ICD-10-CM | POA: Diagnosis not present

## 2022-02-20 NOTE — Progress Notes (Signed)
Charles Hudson presents today for phlebotomy per MD orders. Phlebotomy procedure started at 1254 and ended at 1305. 518 cc removed via 16 G at R antecubital site. Patient tolerated procedure well. Phlebotomy performed by Amelia Jo, RN.

## 2022-02-27 ENCOUNTER — Inpatient Hospital Stay: Payer: BLUE CROSS/BLUE SHIELD

## 2022-02-27 DIAGNOSIS — D751 Secondary polycythemia: Secondary | ICD-10-CM | POA: Diagnosis not present

## 2022-02-27 NOTE — Progress Notes (Signed)
Dickey Gave presents today for phlebotomy per MD orders. Phlebotomy procedure started at 1308 and ended at 1315 510 grams removed via 16 gauge needle to right AC.  Patient observed for 30 minutes after procedure without any incident. Patient tolerated procedure well and received replacement fluids after procedure.  Patient understands to call if he has any questions or concerns post discharge.

## 2022-02-28 ENCOUNTER — Other Ambulatory Visit: Payer: Self-pay | Admitting: Family Medicine

## 2022-03-06 ENCOUNTER — Inpatient Hospital Stay: Payer: BLUE CROSS/BLUE SHIELD | Attending: Hematology & Oncology

## 2022-03-06 DIAGNOSIS — D751 Secondary polycythemia: Secondary | ICD-10-CM | POA: Insufficient documentation

## 2022-03-06 NOTE — Patient Instructions (Signed)

## 2022-03-06 NOTE — Progress Notes (Signed)
Dickey Gave presents today for phlebotomy per MD orders. Phlebotomy procedure started at 1235 and ended at 1252 421 grams removed. Patient observed for 30 minutes after procedure without any incident. Patient tolerated procedure well. IV needle removed intact.

## 2022-03-13 ENCOUNTER — Inpatient Hospital Stay: Payer: BLUE CROSS/BLUE SHIELD | Admitting: Family

## 2022-03-13 ENCOUNTER — Encounter: Payer: Self-pay | Admitting: Family

## 2022-03-13 ENCOUNTER — Inpatient Hospital Stay: Payer: BLUE CROSS/BLUE SHIELD

## 2022-03-13 ENCOUNTER — Other Ambulatory Visit: Payer: Self-pay

## 2022-03-13 DIAGNOSIS — D751 Secondary polycythemia: Secondary | ICD-10-CM | POA: Diagnosis not present

## 2022-03-13 LAB — CBC WITH DIFFERENTIAL (CANCER CENTER ONLY)
Abs Immature Granulocytes: 0.03 10*3/uL (ref 0.00–0.07)
Basophils Absolute: 0.1 10*3/uL (ref 0.0–0.1)
Basophils Relative: 1 %
Eosinophils Absolute: 0.2 10*3/uL (ref 0.0–0.5)
Eosinophils Relative: 2 %
HCT: 36.2 % — ABNORMAL LOW (ref 39.0–52.0)
Hemoglobin: 12.6 g/dL — ABNORMAL LOW (ref 13.0–17.0)
Immature Granulocytes: 0 %
Lymphocytes Relative: 27 %
Lymphs Abs: 2.4 10*3/uL (ref 0.7–4.0)
MCH: 36.1 pg — ABNORMAL HIGH (ref 26.0–34.0)
MCHC: 34.8 g/dL (ref 30.0–36.0)
MCV: 103.7 fL — ABNORMAL HIGH (ref 80.0–100.0)
Monocytes Absolute: 0.7 10*3/uL (ref 0.1–1.0)
Monocytes Relative: 8 %
Neutro Abs: 5.4 10*3/uL (ref 1.7–7.7)
Neutrophils Relative %: 62 %
Platelet Count: 303 10*3/uL (ref 150–400)
RBC: 3.49 MIL/uL — ABNORMAL LOW (ref 4.22–5.81)
RDW: 12.1 % (ref 11.5–15.5)
WBC Count: 8.7 10*3/uL (ref 4.0–10.5)
nRBC: 0 % (ref 0.0–0.2)

## 2022-03-13 LAB — CMP (CANCER CENTER ONLY)
ALT: 62 U/L — ABNORMAL HIGH (ref 0–44)
AST: 41 U/L (ref 15–41)
Albumin: 4.6 g/dL (ref 3.5–5.0)
Alkaline Phosphatase: 85 U/L (ref 38–126)
Anion gap: 9 (ref 5–15)
BUN: 9 mg/dL (ref 6–20)
CO2: 29 mmol/L (ref 22–32)
Calcium: 9.4 mg/dL (ref 8.9–10.3)
Chloride: 102 mmol/L (ref 98–111)
Creatinine: 1.06 mg/dL (ref 0.61–1.24)
GFR, Estimated: >60 mL/min (ref >60)
Glucose, Bld: 95 mg/dL (ref 70–99)
Potassium: 3.4 mmol/L — ABNORMAL LOW (ref 3.5–5.1)
Sodium: 140 mmol/L (ref 135–145)
Total Bilirubin: 0.6 mg/dL (ref 0.3–1.2)
Total Protein: 7.6 g/dL (ref 6.5–8.1)

## 2022-03-13 LAB — IRON AND IRON BINDING CAPACITY (CC-WL,HP ONLY)
Iron: 110 ug/dL (ref 45–182)
Saturation Ratios: 35 % (ref 17.9–39.5)
TIBC: 315 ug/dL (ref 250–450)
UIBC: 205 ug/dL (ref 117–376)

## 2022-03-13 LAB — FERRITIN: Ferritin: 952 ng/mL — ABNORMAL HIGH (ref 24–336)

## 2022-03-13 NOTE — Progress Notes (Signed)
Hematology and Oncology Follow Up Visit  Charles Hudson [id number] Jan 05, 1961 62 y.o. 03/13/2022   Principle Diagnosis:  Hemochromatosis negative but has iron overload JAK-2 negative   Current Therapy:        Phlebotomy to maintain iron saturation < 30%   Interim History:  Charles Hudson is here today for follow-up. He is needing surgery on both of his thumbs for trigger thumb. He is having the right one done first.  He has tolerated phlebotomies nicely so far. Iron saturation most recently was 60% and ferritin 1,072.  No bruising or petechiae.  No fatigue.  No fever, chills, n/v, cough, rash, dizziness, SOB, chest pain, palpitations, abdominal pain or changes in bowel or bladder habits.  No swelling, numbness or tingling in his extremities.  No falls or syncope. Appetite and hydration are good. Weight is stable at 159 lbs.   ECOG Performance Status: 1 - Symptomatic but completely ambulatory  Medications:  Allergies as of 03/13/2022   No Known Allergies      Medication List        Accurate as of March 13, 2022  1:01 PM. If you have any questions, ask your nurse or doctor.          amLODipine 10 MG tablet Commonly known as: NORVASC Take 1 tablet by mouth once daily   aspirin EC 81 MG tablet Take 81 mg by mouth daily. Swallow whole.   atorvastatin 20 MG tablet Commonly known as: LIPITOR Take 1 tablet (20 mg total) by mouth daily.        Allergies: No Known Allergies  Past Medical History, Surgical history, Social history, and Family History were reviewed and updated.  Review of Systems: All other 10 point review of systems is negative.   Physical Exam:  height is '5\' 6"'$  (1.676 m) and weight is 159 lb 3.2 oz (72.2 kg). His oral temperature is 98.2 F (36.8 C). His blood pressure is 135/72 and his pulse is 90. His respiration is 18 and oxygen saturation is 100%.   Wt Readings from Last 3 Encounters:  03/13/22 159 lb 3.2 oz (72.2 kg)  02/13/22 158 lb 2 oz  (71.7 kg)  12/26/21 160 lb (72.6 kg)    Ocular: Sclerae unicteric, pupils equal, round and reactive to light Ear-nose-throat: Oropharynx clear, dentition fair Lymphatic: No cervical or supraclavicular adenopathy Lungs no rales or rhonchi, good excursion bilaterally Heart regular rate and rhythm, no murmur appreciated Abd soft, nontender, positive bowel sounds MSK no focal spinal tenderness, no joint edema Neuro: non-focal, well-oriented, appropriate affect Breasts: Deferred   Lab Results  Component Value Date   WBC 8.7 03/13/2022   HGB 12.6 (L) 03/13/2022   HCT 36.2 (L) 03/13/2022   MCV 103.7 (H) 03/13/2022   PLT 303 03/13/2022   Lab Results  Component Value Date   FERRITIN 1,072 (H) 02/06/2022   IRON 198 (H) 02/06/2022   TIBC 330 02/06/2022   UIBC 132 02/06/2022   IRONPCTSAT 60 (H) 02/06/2022   Lab Results  Component Value Date   RETICCTPCT 2.2 11/18/2021   RBC 3.49 (L) 03/13/2022   No results found for: "KPAFRELGTCHN", "LAMBDASER", "KAPLAMBRATIO" No results found for: "IGGSERUM", "IGA", "IGMSERUM" No results found for: "TOTALPROTELP", "ALBUMINELP", "A1GS", "A2GS", "BETS", "BETA2SER", "GAMS", "MSPIKE", "SPEI"   Chemistry      Component Value Date/Time   NA 141 02/06/2022 1245   K 4.1 02/06/2022 1245   CL 103 02/06/2022 1245   CO2 30 02/06/2022 1245   BUN 9 02/06/2022  1245   CREATININE 0.96 02/06/2022 1245      Component Value Date/Time   CALCIUM 9.5 02/06/2022 1245   ALKPHOS 83 02/06/2022 1245   AST 41 02/06/2022 1245   ALT 52 (H) 02/06/2022 1245   BILITOT 0.9 02/06/2022 1245       Impression and Plan: Charles Hudson is a very pleasant 62 yo caucasian gentleman with erythrocytosis and iron overload. His JAK 2 and hemochromatosis DNA were both negative. I had discussed with Dr. Marin Olp and it is felt that he does have some sort of hemochromatosis.  Iron studies are pending. We will set him up for phlebotomy if needed.  Lab check monthly, follow-up in 3 months.    Charles Dawson, NP 1/12/20241:01 PM

## 2022-03-30 ENCOUNTER — Telehealth: Payer: Self-pay

## 2022-03-30 NOTE — Telephone Encounter (Signed)
Received phone call from patient wife stating that her husband Charles Hudson) had labs drawn the week of January 15 and had not received any communication about his labs or schedule or if he needed a phlebotomy. This RN called patient back and spoke with patient wife Charles Hudson. This RN informed Charles Hudson that there was no mychart message sent about a phlebotomy as the patient iron saturations have been coming down nicely and a phlebotomy wasn't needed. Charles Hudson stated that "yall need to step up your game over there because we have a friend in [place] who has the same problem and received multiple handouts and a treatment plan." Charles Hudson stated that they have asked multiple times for a treatment plan and schedule and have yet to receive one. Charles Hudson aware that appointments should be accessible on Mychart and she stated that they are not. Apologies offered and Charles Hudson informed that this office has no management of MyChart.This RN reviewed patient next lab appointments with wife who stated "well Mondays don't work for [place] and we have told multiple people and no one remembers" This RN educated Charles Hudson that we encourage our patients to make their appointments as they leave as we have many patients and limited appointment slots. I told Charles Hudson I would send a scheduling message to have scheduling call to adjust their appointments to a day that is better for them. Charles Hudson stated that she was sad for other patients who don't have as much knowledge or doctor friends as her and Charles Hudson because "this office is unprofessional in all aspects and I am not being ugly but its just sad." Charles Hudson stated that she thinks the RNs use a "mismanaged skill set while performing phlebotomies" as "he had labs drawn in one arm and the phlebotomy in the other but no one monitors his blood pressure during the procedure even though yall do it all the time and that is a problem" Charles Hudson stated that they have yet to see a "doctor" as they have only seen the NP. This RN stated  that Lottie Dawson is very thorough with her patients and all patients are discussed with Dr. Marin Olp. Charles Hudson stated that they only knew about Tommy's lab appointments because Dr. Rudean Haskell was nice enough to print a calendar for them and Dr. Nani Ravens recommended they find a new physician office. This RN offered to Charles Hudson if they would like to transfer care and Charles Hudson stated "we will let this play out for awhile before we make a decision because it is so close to our house and if the communication improves we will stay." This RN offered multiple apologies during the entire conversation and offered to let Charles Hudson talk to the nurse manager and she stated "no that's ok it will just fall on deaf ears" Charles Hudson stated that she was glad she was called back today as "last time I called on a Monday I didn't hear anything back til Thursday" This RN stated that the nurses strive to answer all voicemails same day but I was unable to provide answers. Apologies offered again and Charles Hudson aware of next appointment date and times. Charles Hudson verbalized understanding and had no further questions.

## 2022-04-07 ENCOUNTER — Other Ambulatory Visit: Payer: Self-pay | Admitting: Family Medicine

## 2022-04-07 ENCOUNTER — Telehealth: Payer: Self-pay | Admitting: Family Medicine

## 2022-04-07 DIAGNOSIS — D582 Other hemoglobinopathies: Secondary | ICD-10-CM

## 2022-04-07 DIAGNOSIS — D751 Secondary polycythemia: Secondary | ICD-10-CM

## 2022-04-07 NOTE — Telephone Encounter (Signed)
Patients wife informed referral sent to [facility] in [place]---[phone].

## 2022-04-07 NOTE — Telephone Encounter (Signed)
Spoke to the patients wife and they were asked to leave the previous clinic. Please advise on referring somewhere else.

## 2022-04-07 NOTE — Telephone Encounter (Signed)
Patient's wife Charles Hudson called to get a recommendation of a new phlebotomy clinic for patient. Please call to advise at [phone]

## 2022-04-07 NOTE — Telephone Encounter (Signed)
Referral done

## 2022-04-13 ENCOUNTER — Other Ambulatory Visit: Payer: Self-pay | Admitting: Urology

## 2022-04-13 ENCOUNTER — Inpatient Hospital Stay: Payer: BLUE CROSS/BLUE SHIELD | Attending: Hematology & Oncology

## 2022-04-13 DIAGNOSIS — D751 Secondary polycythemia: Secondary | ICD-10-CM | POA: Insufficient documentation

## 2022-04-13 DIAGNOSIS — N403 Nodular prostate with lower urinary tract symptoms: Secondary | ICD-10-CM

## 2022-04-13 LAB — CMP (CANCER CENTER ONLY)
ALT: 58 U/L — ABNORMAL HIGH (ref 0–44)
AST: 40 U/L (ref 15–41)
Albumin: 4.7 g/dL (ref 3.5–5.0)
Alkaline Phosphatase: 93 U/L (ref 38–126)
Anion gap: 9 (ref 5–15)
BUN: 9 mg/dL (ref 6–20)
CO2: 28 mmol/L (ref 22–32)
Calcium: 9.2 mg/dL (ref 8.9–10.3)
Chloride: 100 mmol/L (ref 98–111)
Creatinine: 0.9 mg/dL (ref 0.61–1.24)
GFR, Estimated: >60 mL/min (ref >60)
Glucose, Bld: 216 mg/dL — ABNORMAL HIGH (ref 70–99)
Potassium: 3.8 mmol/L (ref 3.5–5.1)
Sodium: 137 mmol/L (ref 135–145)
Total Bilirubin: 0.8 mg/dL (ref 0.3–1.2)
Total Protein: 8 g/dL (ref 6.5–8.1)

## 2022-04-13 LAB — CBC WITH DIFFERENTIAL (CANCER CENTER ONLY)
Abs Immature Granulocytes: 0.03 10*3/uL (ref 0.00–0.07)
Basophils Absolute: 0.1 10*3/uL (ref 0.0–0.1)
Basophils Relative: 1 %
Eosinophils Absolute: 0.2 10*3/uL (ref 0.0–0.5)
Eosinophils Relative: 2 %
HCT: 45.6 % (ref 39.0–52.0)
Hemoglobin: 15.6 g/dL (ref 13.0–17.0)
Immature Granulocytes: 0 %
Lymphocytes Relative: 28 %
Lymphs Abs: 2.3 10*3/uL (ref 0.7–4.0)
MCH: 34.7 pg — ABNORMAL HIGH (ref 26.0–34.0)
MCHC: 34.2 g/dL (ref 30.0–36.0)
MCV: 101.3 fL — ABNORMAL HIGH (ref 80.0–100.0)
Monocytes Absolute: 0.5 10*3/uL (ref 0.1–1.0)
Monocytes Relative: 6 %
Neutro Abs: 5 10*3/uL (ref 1.7–7.7)
Neutrophils Relative %: 63 %
Platelet Count: 274 10*3/uL (ref 150–400)
RBC: 4.5 MIL/uL (ref 4.22–5.81)
RDW: 11.3 % — ABNORMAL LOW (ref 11.5–15.5)
WBC Count: 8 10*3/uL (ref 4.0–10.5)
nRBC: 0 % (ref 0.0–0.2)

## 2022-04-13 LAB — IRON AND IRON BINDING CAPACITY (CC-WL,HP ONLY)
Iron: 162 ug/dL (ref 45–182)
Saturation Ratios: 49 % — ABNORMAL HIGH (ref 17.9–39.5)
TIBC: 329 ug/dL (ref 250–450)
UIBC: 167 ug/dL (ref 117–376)

## 2022-04-13 LAB — FERRITIN: Ferritin: 804 ng/mL — ABNORMAL HIGH (ref 24–336)

## 2022-05-12 ENCOUNTER — Inpatient Hospital Stay: Payer: BLUE CROSS/BLUE SHIELD

## 2022-05-21 ENCOUNTER — Ambulatory Visit
Admission: RE | Admit: 2022-05-21 | Discharge: 2022-05-21 | Disposition: A | Discharge status: Home / Self Care | Payer: BLUE CROSS/BLUE SHIELD | Source: Ambulatory Visit | Attending: Urology | Admitting: Urology

## 2022-05-21 DIAGNOSIS — N403 Nodular prostate with lower urinary tract symptoms: Secondary | ICD-10-CM

## 2022-05-21 MED ORDER — GADOPICLENOL 0.5 MMOL/ML IV SOLN
7.0000 mL | Freq: Once | INTRAVENOUS | Status: AC | PRN
  Administered 2022-05-21: 7 mL via INTRAVENOUS

## 2022-06-01 ENCOUNTER — Other Ambulatory Visit: Payer: Self-pay | Admitting: Family Medicine

## 2022-06-08 ENCOUNTER — Other Ambulatory Visit: Payer: Self-pay | Admitting: Family Medicine

## 2022-06-12 ENCOUNTER — Inpatient Hospital Stay: Payer: BLUE CROSS/BLUE SHIELD

## 2022-06-12 ENCOUNTER — Inpatient Hospital Stay: Payer: BLUE CROSS/BLUE SHIELD | Admitting: Family

## 2022-07-10 ENCOUNTER — Other Ambulatory Visit: Payer: Self-pay | Admitting: Family Medicine

## 2022-07-31 ENCOUNTER — Other Ambulatory Visit (HOSPITAL_COMMUNITY): Payer: Self-pay | Admitting: Oncology

## 2022-08-03 ENCOUNTER — Telehealth: Payer: Self-pay | Admitting: Family Medicine

## 2022-08-03 NOTE — Telephone Encounter (Signed)
Called and informed the patients wife. They are so pleased and happy with new Hematologist.

## 2022-08-03 NOTE — Telephone Encounter (Signed)
Pt's wife called to advise that Pt needs an order for colonoscopy and he usually gets it at [facility] on [address].

## 2022-08-03 NOTE — Telephone Encounter (Signed)
Last done 03/17/2018

## 2022-08-11 ENCOUNTER — Other Ambulatory Visit: Payer: Self-pay | Admitting: Family Medicine

## 2022-08-25 ENCOUNTER — Other Ambulatory Visit: Payer: Self-pay | Admitting: Family Medicine

## 2022-09-08 ENCOUNTER — Other Ambulatory Visit: Payer: Self-pay | Admitting: Family Medicine

## 2022-10-13 ENCOUNTER — Other Ambulatory Visit: Payer: Self-pay | Admitting: Family Medicine

## 2022-10-14 ENCOUNTER — Other Ambulatory Visit (HOSPITAL_COMMUNITY): Payer: Self-pay | Admitting: Oncology

## 2022-10-14 ENCOUNTER — Ambulatory Visit (HOSPITAL_COMMUNITY)
Admission: RE | Admit: 2022-10-14 | Discharge: 2022-10-14 | Disposition: A | Discharge status: Home / Self Care | Payer: BLUE CROSS/BLUE SHIELD | Source: Ambulatory Visit | Attending: Oncology | Admitting: Oncology

## 2022-10-14 MED ORDER — GADOBUTROL 1 MMOL/ML IV SOLN
10.0000 mL | Freq: Once | INTRAVENOUS | Status: AC | PRN
  Administered 2022-10-14: 10 mL via INTRAVENOUS

## 2022-10-15 ENCOUNTER — Encounter (INDEPENDENT_AMBULATORY_CARE_PROVIDER_SITE_OTHER): Payer: Self-pay

## 2022-10-21 ENCOUNTER — Ambulatory Visit: Payer: BLUE CROSS/BLUE SHIELD | Admitting: Family Medicine

## 2022-10-23 ENCOUNTER — Ambulatory Visit: Payer: BLUE CROSS/BLUE SHIELD | Admitting: Family Medicine

## 2022-11-06 ENCOUNTER — Ambulatory Visit (INDEPENDENT_AMBULATORY_CARE_PROVIDER_SITE_OTHER): Payer: BLUE CROSS/BLUE SHIELD | Admitting: Family Medicine

## 2022-11-06 ENCOUNTER — Encounter: Payer: Self-pay | Admitting: Family Medicine

## 2022-11-06 ENCOUNTER — Telehealth: Payer: Self-pay | Admitting: Family Medicine

## 2022-11-06 VITALS — BP 132/82 | HR 84 | Temp 98.6°F | Ht 66.0 in | Wt 158.0 lb

## 2022-11-06 DIAGNOSIS — Z0000 Encounter for general adult medical examination without abnormal findings: Secondary | ICD-10-CM

## 2022-11-06 LAB — CBC
HCT: 43.5 % (ref 39.0–52.0)
Hemoglobin: 14.5 g/dL (ref 13.0–17.0)
MCHC: 33.2 g/dL (ref 30.0–36.0)
MCV: 91.1 fl (ref 78.0–100.0)
Platelets: 376 10*3/uL (ref 150.0–400.0)
RBC: 4.78 Mil/uL (ref 4.22–5.81)
RDW: 13.5 % (ref 11.5–15.5)
WBC: 7.1 10*3/uL (ref 4.0–10.5)

## 2022-11-06 LAB — COMPREHENSIVE METABOLIC PANEL
ALT: 19 U/L (ref 0–53)
AST: 20 U/L (ref 0–37)
Albumin: 4.3 g/dL (ref 3.5–5.2)
Alkaline Phosphatase: 66 U/L (ref 39–117)
BUN: 7 mg/dL (ref 6–23)
CO2: 30 meq/L (ref 19–32)
Calcium: 9.2 mg/dL (ref 8.4–10.5)
Chloride: 100 meq/L (ref 96–112)
Creatinine, Ser: 0.97 mg/dL (ref 0.40–1.50)
GFR: 85.17 mL/min (ref >60.00)
Glucose, Bld: 96 mg/dL (ref 70–99)
Potassium: 4.3 meq/L (ref 3.5–5.1)
Sodium: 137 meq/L (ref 135–145)
Total Bilirubin: 0.7 mg/dL (ref 0.2–1.2)
Total Protein: 7.4 g/dL (ref 6.0–8.3)

## 2022-11-06 LAB — LIPID PANEL
Cholesterol: 121 mg/dL (ref 0–200)
HDL: 47.8 mg/dL (ref >39.00)
LDL Cholesterol: 55 mg/dL (ref 0–99)
NonHDL: 72.94
Total CHOL/HDL Ratio: 3
Triglycerides: 88 mg/dL (ref 0.0–149.0)
VLDL: 17.6 mg/dL (ref 0.0–40.0)

## 2022-11-06 MED ORDER — ATORVASTATIN CALCIUM 20 MG PO TABS: 20.0000 mg | ORAL_TABLET | Freq: Every day | ORAL | 3 refills | Status: DC

## 2022-11-06 NOTE — Telephone Encounter (Signed)
Pt states he does not use mychart and he would rather a nurse call him with his lab results.

## 2022-11-06 NOTE — Progress Notes (Signed)
Chief Complaint  Patient presents with   Annual Exam    Well Male Charles Hudson is here for a complete physical.   His last physical was >1 year ago.  Current diet: in general, diet is fair.  Current exercise: active at work Weight trend: stable Fatigue out of ordinary? No. Seat belt? Yes.   Advanced directive? Yes  Health maintenance Shingrix- Yes Colonoscopy- Yes Tetanus- Yes HIV- Yes Hep C- Yes Lung cancer screening- Yes   Past Medical History:  Diagnosis Date   Essential hypertension    Hyperlipidemia       Past Surgical History:  Procedure Laterality Date   KNEE ARTHROSCOPY  1991   KNEE ARTHROSCOPY     WISDOM TOOTH EXTRACTION      Medications  Current Outpatient Medications on File Prior to Visit  Medication Sig Dispense Refill   amLODipine (NORVASC) 10 MG tablet Take 1 tablet (10 mg total) by mouth daily. 90 tablet 1   aspirin EC 81 MG tablet Take 81 mg by mouth daily. Swallow whole.     folic acid (FOLVITE) 400 MCG tablet Take 400 mcg by mouth daily.     Allergies No Known Allergies  Family History Family History  Problem Relation Age of Onset   Alzheimer's disease Father    Colon cancer Neg Hx    Colon polyps Neg Hx    Esophageal cancer Neg Hx    Rectal cancer Neg Hx    Stomach cancer Neg Hx     Review of Systems: Constitutional:  no fevers Eye:  no recent significant change in vision Ear/Nose/Mouth/Throat:  Ears:  no hearing loss Nose/Mouth/Throat:  no complaints of nasal congestion, no sore throat Cardiovascular:  no chest pain Respiratory:  no shortness of breath Gastrointestinal:  no change in bowel habits GU:  Male: negative for dysuria, frequency Musculoskeletal/Extremities:  no joint pain Integumentary (Skin/Breast):  no abnormal skin lesions reported Neurologic:  no headaches Endocrine: No unexpected weight changes Hematologic/Lymphatic:  no abnormal bleeding  Exam BP 132/82 (BP Location: Left Arm, Patient Position: Sitting,  Cuff Size: Normal)   Pulse 84   Temp 98.6 F (37 C) (Oral)   Ht 5\' 6"  (1.676 m)   Wt 158 lb (71.7 kg)   SpO2 96%   BMI 25.50 kg/m  General:  well developed, well nourished, in no apparent distress Skin:  no significant moles, warts, or growths Head:  no masses, lesions, or tenderness Eyes:  pupils equal and round, sclera anicteric without injection Ears:  canals without lesions, TMs shiny without retraction, no obvious effusion, no erythema Nose:  nares patent, mucosa normal Throat/Pharynx:  lips and gingiva without lesion; tongue and uvula midline; non-inflamed pharynx; no exudates or postnasal drainage Neck: neck supple without adenopathy, thyromegaly, or masses Cardiac: RRR, no bruits, no LE edema Lungs:  clear to auscultation, breath sounds equal bilaterally, no respiratory distress Abdomen: BS+, soft, non-tender, non-distended, no masses or organomegaly noted Rectal: Deferred Musculoskeletal:  symmetrical muscle groups noted without atrophy or deformity Neuro:  gait normal; deep tendon reflexes normal and symmetric Psych: well oriented with normal range of affect and appropriate judgment/insight  Assessment and Plan  Well adult exam - Plan: CBC, Comprehensive metabolic panel, Lipid panel   Well 62 y.o. male. Counseled on diet and exercise. Counseled on risks and benefits of prostate cancer screening with PSA. He follows with the urology team.  Immunizations, labs, and further orders as above. Follow up in 6 mo. The patient voiced understanding and agreement  to the plan.  Jilda Roche [place], DO 11/06/22 9:33 AM

## 2022-11-06 NOTE — Addendum Note (Signed)
Addended by: Scharlene Gloss B on: 11/06/2022 09:53 AM   Modules accepted: Orders

## 2022-11-06 NOTE — Patient Instructions (Addendum)
Give us 2-3 business days to get the results of your labs back.   Keep the diet clean and stay active.  I recommend getting the flu shot in mid October. This suggestion would change if the CDC comes out with a different recommendation.   Let us know if you need anything. 

## 2022-11-06 NOTE — Telephone Encounter (Signed)
Will ask someone at the front to fix this.

## 2022-12-23 ENCOUNTER — Telehealth (HOSPITAL_BASED_OUTPATIENT_CLINIC_OR_DEPARTMENT_OTHER): Payer: Self-pay

## 2022-12-28 ENCOUNTER — Ambulatory Visit (HOSPITAL_BASED_OUTPATIENT_CLINIC_OR_DEPARTMENT_OTHER)
Admission: RE | Admit: 2022-12-28 | Discharge: 2022-12-28 | Disposition: A | Discharge status: Home / Self Care | Payer: BLUE CROSS/BLUE SHIELD | Source: Ambulatory Visit | Attending: Acute Care | Admitting: Acute Care

## 2022-12-28 DIAGNOSIS — Z122 Encounter for screening for malignant neoplasm of respiratory organs: Secondary | ICD-10-CM | POA: Diagnosis present

## 2022-12-28 DIAGNOSIS — Z87891 Personal history of nicotine dependence: Secondary | ICD-10-CM | POA: Diagnosis present

## 2023-01-15 ENCOUNTER — Encounter: Payer: Self-pay | Admitting: Family

## 2023-01-15 ENCOUNTER — Encounter (HOSPITAL_BASED_OUTPATIENT_CLINIC_OR_DEPARTMENT_OTHER): Payer: Self-pay

## 2023-01-15 ENCOUNTER — Other Ambulatory Visit: Payer: Self-pay

## 2023-01-15 ENCOUNTER — Emergency Department (HOSPITAL_BASED_OUTPATIENT_CLINIC_OR_DEPARTMENT_OTHER)
Admission: EM | Admit: 2023-01-15 | Discharge: 2023-01-15 | Disposition: A | Discharge status: Home / Self Care | Payer: Worker's Compensation | Attending: Emergency Medicine | Admitting: Emergency Medicine

## 2023-01-15 ENCOUNTER — Emergency Department (HOSPITAL_BASED_OUTPATIENT_CLINIC_OR_DEPARTMENT_OTHER)
Admission: EM | Admit: 2023-01-15 | Discharge: 2023-01-15 | Discharge status: Against Medical Advice | Payer: Worker's Compensation | Source: Home / Self Care

## 2023-01-15 DIAGNOSIS — W268XXA Contact with other sharp object(s), not elsewhere classified, initial encounter: Secondary | ICD-10-CM | POA: Diagnosis not present

## 2023-01-15 DIAGNOSIS — Z7982 Long term (current) use of aspirin: Secondary | ICD-10-CM | POA: Diagnosis not present

## 2023-01-15 DIAGNOSIS — Z4800 Encounter for change or removal of nonsurgical wound dressing: Secondary | ICD-10-CM | POA: Insufficient documentation

## 2023-01-15 DIAGNOSIS — Z0283 Encounter for blood-alcohol and blood-drug test: Secondary | ICD-10-CM | POA: Insufficient documentation

## 2023-01-15 DIAGNOSIS — Z5321 Procedure and treatment not carried out due to patient leaving prior to being seen by health care provider: Secondary | ICD-10-CM | POA: Insufficient documentation

## 2023-01-15 DIAGNOSIS — S61411A Laceration without foreign body of right hand, initial encounter: Secondary | ICD-10-CM | POA: Insufficient documentation

## 2023-01-15 DIAGNOSIS — Y990 Civilian activity done for income or pay: Secondary | ICD-10-CM | POA: Insufficient documentation

## 2023-01-15 DIAGNOSIS — S6991XA Unspecified injury of right wrist, hand and finger(s), initial encounter: Secondary | ICD-10-CM | POA: Diagnosis present

## 2023-01-15 MED ORDER — LIDOCAINE-EPINEPHRINE-TETRACAINE (LET) TOPICAL GEL
3.0000 mL | Freq: Once | TOPICAL | Status: AC
  Administered 2023-01-15: 3 mL via TOPICAL
  Filled 2023-01-15: qty 3

## 2023-01-15 NOTE — ED Notes (Addendum)
Urine specimen in lab 

## 2023-01-15 NOTE — ED Notes (Signed)
Cleaned wound and non-stick bandage

## 2023-01-15 NOTE — ED Notes (Signed)
Reviewed discharge instructions and laceration care with pt. Dressing applied. Pt states understanding.

## 2023-01-15 NOTE — ED Notes (Signed)
Patient had originally returned to ED for Drug/Alcohol Screening (was in ED earlier today for laceration repair and was treated and discharged) but decided to leave as he had scheduled an appointment to another site ([facility]) for screening. To be discharged accordingly.

## 2023-01-15 NOTE — Discharge Instructions (Addendum)
Your 2 stitches will need to be removed in 7-10 days.  This can happen at an urgent care, doctor's office, or else the ER.  Keep your stitches dry for the next 24 hours.  After that you can shower normally with soap and water.  If you are working around dirty substances, keep your wound dressed and wrapped.

## 2023-01-15 NOTE — ED Triage Notes (Signed)
Pt presents with complaint of laceration/skin tear. Hit right hand on piece of metal at work this am. Bleeding controlled . Takes asa daily

## 2023-01-15 NOTE — ED Provider Notes (Signed)
[facility] Provider Note   CSN: [id number] Arrival date & time: 01/15/23  4098     History  Chief Complaint  Patient presents with   Laceration    Charles Hudson is a 62 y.o. male presenting to the ED with laceration to right hand, accidental injury at work cutting hand on piece of metal.  He is left handed.  Takes aspirin daily, no A/C.  No other injuries  HPI     Home Medications Prior to Admission medications   Medication Sig Start Date End Date Taking? Authorizing Provider  amLODipine (NORVASC) 10 MG tablet Take 1 tablet (10 mg total) by mouth daily. 08/25/22   Sharlene Dory, DO  aspirin EC 81 MG tablet Take 81 mg by mouth daily. Swallow whole.    [provider]  atorvastatin (LIPITOR) 20 MG tablet Take 1 tablet (20 mg total) by mouth daily. 11/06/22   Sharlene Dory, DO  cyanocobalamin (VITAMIN B12) 1000 MCG/ML injection Inject 1,000 mcg into the muscle every 30 (thirty) days.    [provider]  folic acid (FOLVITE) 400 MCG tablet Take 400 mcg by mouth daily.    [provider]      Allergies    Patient has no known allergies.    Review of Systems   Review of Systems  Physical Exam Updated Vital Signs BP (!) 159/81 (BP Location: Left Arm)   Pulse 78   Temp 98.6 F (37 C) (Oral)   Resp 17   Ht 5\' 6"  (1.676 m)   Wt 72.3 kg   SpO2 100%   BMI 25.74 kg/m  Physical Exam Constitutional:      General: He is not in acute distress. HENT:     Head: Normocephalic and atraumatic.  Eyes:     Conjunctiva/sclera: Conjunctivae normal.     Pupils: Pupils are equal, round, and reactive to light.  Cardiovascular:     Rate and Rhythm: Normal rate and regular rhythm.  Pulmonary:     Effort: Pulmonary effort is normal. No respiratory distress.  Musculoskeletal:     Comments: 1.5 cm laceration to right dorsal aspect of hand to subcutaneous fat, full ROM of fingers, no streaking  erythema  Skin:    General: Skin is warm and dry.  Neurological:     General: No focal deficit present.     Mental Status: He is alert. Mental status is at baseline.  Psychiatric:        Mood and Affect: Mood normal.        Behavior: Behavior normal.     ED Results / Procedures / Treatments   Labs (all labs ordered are listed, but only abnormal results are displayed) Labs Reviewed - No data to display  EKG None  Radiology No results found.  Procedures .Marland KitchenLaceration Repair  Date/Time: 01/15/2023 8:15 AM  Performed by: Terald Sleeper, MD Authorized by: Terald Sleeper, MD   Consent:    Consent obtained:  Verbal   Consent given by:  Patient   Risks discussed:  Poor cosmetic result, poor wound healing, pain and infection Universal protocol:    Procedure explained and questions answered to patient or proxy's satisfaction: yes     Relevant documents present and verified: yes     Test results available: yes     Imaging studies available: yes     Required blood products, implants, devices, and special equipment available: yes     Site/side marked:  yes     Immediately prior to procedure, a time out was called: yes     Patient identity confirmed:  Arm band Anesthesia:    Anesthesia method:  Topical application   Topical anesthetic:  LET Laceration details:    Location:  Hand   Hand location:  R hand, dorsum   Length (cm):  1.5   Depth (mm):  3 Pre-procedure details:    Preparation:  Patient was prepped and draped in usual sterile fashion Exploration:    Imaging outcome: foreign body not noted     Wound exploration: wound explored through full range of motion     Contaminated: no   Treatment:    Area cleansed with:  Saline Skin repair:    Repair method:  Sutures   Suture size:  4-0   Suture material:  Prolene   Suture technique:  Simple interrupted   Number of sutures:  2 Approximation:    Approximation:  Close Repair type:    Repair type:   Simple Post-procedure details:    Dressing:  Non-adherent dressing   Procedure completion:  Tolerated well, no immediate complications     Medications Ordered in ED Medications  lidocaine-EPINEPHrine-tetracaine (LET) topical gel (3 mLs Topical Given 01/15/23 0758)    ED Course/ Medical Decision Making/ A&P Clinical Course as of 01/15/23 0816  Fri Jan 15, 2023  1610 Tetanus up to date per patient report [MT]    Clinical Course User Index [MT] Terald Sleeper, MD                                 Medical Decision Making  Isolated injury to right hand Wound cleaned, repaired with sutures No evidence of infection Low suspicion for underlying fracture  Advised suture removal 7-10 days, he will do this at PCP's office  Okay for discharge        Final Clinical Impression(s) / ED Diagnoses Final diagnoses:  Laceration of right hand without foreign body, initial encounter    Rx / DC Orders ED Discharge Orders     None         Terald Sleeper, MD 01/15/23 [phone]

## 2023-01-27 ENCOUNTER — Other Ambulatory Visit: Payer: Self-pay | Admitting: Acute Care

## 2023-01-27 DIAGNOSIS — Z87891 Personal history of nicotine dependence: Secondary | ICD-10-CM

## 2023-01-27 DIAGNOSIS — Z122 Encounter for screening for malignant neoplasm of respiratory organs: Secondary | ICD-10-CM

## 2023-02-21 ENCOUNTER — Other Ambulatory Visit: Payer: Self-pay | Admitting: Family Medicine

## 2023-05-03 ENCOUNTER — Encounter: Payer: Self-pay | Admitting: Gastroenterology

## 2023-05-07 ENCOUNTER — Encounter: Payer: Self-pay | Admitting: Gastroenterology

## 2023-05-07 ENCOUNTER — Ambulatory Visit: Payer: BLUE CROSS/BLUE SHIELD | Admitting: Family Medicine

## 2023-05-07 ENCOUNTER — Encounter: Payer: Self-pay | Admitting: Family Medicine

## 2023-05-07 VITALS — BP 134/76 | HR 81 | Temp 97.6°F | Resp 20 | Ht 66.0 in | Wt 160.2 lb

## 2023-05-07 DIAGNOSIS — E7800 Pure hypercholesterolemia, unspecified: Secondary | ICD-10-CM | POA: Diagnosis not present

## 2023-05-07 DIAGNOSIS — M7711 Lateral epicondylitis, right elbow: Secondary | ICD-10-CM

## 2023-05-07 DIAGNOSIS — I10 Essential (primary) hypertension: Secondary | ICD-10-CM

## 2023-05-07 LAB — COMPREHENSIVE METABOLIC PANEL
ALT: 17 U/L (ref 0–53)
AST: 17 U/L (ref 0–37)
Albumin: 4.5 g/dL (ref 3.5–5.2)
Alkaline Phosphatase: 59 U/L (ref 39–117)
BUN: 11 mg/dL (ref 6–23)
CO2: 29 meq/L (ref 19–32)
Calcium: 9.1 mg/dL (ref 8.4–10.5)
Chloride: 101 meq/L (ref 96–112)
Creatinine, Ser: 0.9 mg/dL (ref 0.40–1.50)
GFR: 92.86 mL/min (ref >60.00)
Glucose, Bld: 65 mg/dL — ABNORMAL LOW (ref 70–99)
Potassium: 3.6 meq/L (ref 3.5–5.1)
Sodium: 139 meq/L (ref 135–145)
Total Bilirubin: 0.8 mg/dL (ref 0.2–1.2)
Total Protein: 7.2 g/dL (ref 6.0–8.3)

## 2023-05-07 LAB — LIPID PANEL
Cholesterol: 112 mg/dL (ref 0–200)
HDL: 41.5 mg/dL (ref >39.00)
LDL Cholesterol: 47 mg/dL (ref 0–99)
NonHDL: 70.69
Total CHOL/HDL Ratio: 3
Triglycerides: 118 mg/dL (ref 0.0–149.0)
VLDL: 23.6 mg/dL (ref 0.0–40.0)

## 2023-05-07 MED ORDER — TADALAFIL 20 MG PO TABS: 10.0000 mg | ORAL_TABLET | ORAL | 2 refills | Status: AC | PRN

## 2023-05-07 NOTE — Patient Instructions (Addendum)
 Please contact the GI team at: [phone]  Give [place] 2-3 business days to get the results of your labs back.   Keep the diet clean and stay active.  Consider GoodRx for the tadalafil.   Consider a forearm strap (Band-IT) to help with your elbow. This can give your elbow a break and allow it to heal faster.   Let us know if you need anything.  Elbow and Forearm Exercises It is normal to feel mild stretching, pulling, tightness, or discomfort as you do these exercises, but you should stop right away if you feel sudden pain or your pain gets worse.  RANGE OF MOTION EXERCISES These exercises warm up your muscles and joints and improve the movement and flexibility of your injured elbow and forearm. These exercises also help to relieve pain, numbness, and tingling. These exercises are done using the muscles in your injured elbow and forearm. Exercise A: Elbow Flexion, Active Hold your left / right arm at your side, and bend your elbow as far as you can using your left / right arm muscles. Hold this position for 30 seconds. Slowly return to the starting position. Repeat 2 times. Complete this exercise 3 times per week. Exercise B: Elbow Extension, Active Hold your left / right arm at your side, and straighten your elbow as much as you can using your left / right arm muscles. Hold this position for 30 seconds. Slowly return to the starting position. Repeat 2 times. Complete this exercise 3 times per week. Exercise C: Forearm Rotation, Supination, Active Stand or sit with your elbows at your sides. Bend your left / right elbow to an "L" shape (90 degrees). Turn your palm upward until you feel a gentle stretch on the inside of your forearm. Hold this position for 30 seconds. Slowly release and return to the starting position. Repeat 2 times. Complete this exercise 3 times per week. Exercise D: Forearm Rotation, Pronation, Active Stand or sit with your elbows at your side. Bend your left /  right elbow to an "L" shape (90 degrees). Turn your left / right palm downward until you feel a gentle stretch on the top of your forearm. Hold this position for 30 seconds. Slowly release and return to the starting position. Repeat 2 times. Complete this exercise 3 times per week. STRETCHING EXERCISES These exercises warm up your muscles and joints and improve the movement and flexibility of your injured elbow and forearm. These exercises also help to relieve pain, numbness, and tingling. These exercises are done using your healthy elbow and forearm to help stretch the muscles in your injured elbow and forearm. Exercise E: Elbow Flexion, Active-Assisted  Hold your left / right arm at your side, and bend your elbow as much as you can using your left / right arm muscles. Use your other hand to bend your left / right elbow farther. To do this, gently push up on your forearm until you feel a gentle stretch on the back of your elbow. Hold this position for 30 seconds. Slowly return to the starting position. Repeat 2 times. Complete this exercise 3 times per week. Exercise F: Elbow Extension, Active-Assisted  Hold your left / right arm at your side, and straighten your elbow as much as you can using your left / right arm muscles. Use your other hand to straighten the left / right elbow farther. To do this, gently push down on your forearm until you feel a gentle stretch on the inside of your elbow.  Hold this position for 30 seconds. Slowly return to the starting position. Repeat 2 times. Complete this exercise 3 times per weeky. Exercise G: Forearm Rotation, Supination, Active-Assisted  Sit with your left / right elbow bent in an "L" shape (90 degrees) with your forearm resting on a table. Keeping your upper body and shoulder still, rotate your forearm so your left / right palm faces upward. Use your other hand to help rotate your forearm further until you feel a gentle to moderate stretch. Hold  this position for 30 seconds. Slowly release the stretch and return to the starting position. Repeat 2 times. Complete this exercise 3 times per week. Exercise H: Forearm Rotation, Pronation, Active-Assisted  Sit with your left / right elbow bent in an "L" shape (90 degrees) with your forearm resting on a table. Keeping your upper body and shoulder still, rotate your forearm so your palm faces the tabletop. Use your other hand to help rotate your forearm further until you feel a gentle to moderate stretch. Hold this position for 30 seconds. Slowly release the stretch and return to the starting position. Repeat 2 times. Complete this exercise 3 times per week. Exercise I: Elbow Flexion, Supine, Passive Lie on your back. Extend your left / right arm up in the air, bracing it with your other hand. Let your left / right your hand slowly lower toward your shoulder, while your elbow stays pointed toward the ceiling. You should feel a gentle stretch along the back of your upper arm and elbow. If instructed by your health care provider, you may increase the intensity of your stretch by adding a small wrist weight or hand weight. Hold this position for 3 seconds. Slowly return to the starting position. Repeat 2 times. Complete this exercise 3 times per week. Exercise J: Elbow Extension, Supine, Passive  Lie on your back. Make sure that you are in a comfortable position that lets you relax your arm muscles. Place a folded towel under your left / right upper arm so your elbow and shoulder are at the same height. Straighten your left / right arm so your elbow does not rest on the bed or towel. Let the weight of your hand stretch your elbow. Keep your arm and chest muscles relaxed. You should feel a stretch on the inside of your elbow. If told by your health care provider, you may increase the intensity of your stretch by adding a small wrist weight or hand weight. Hold this position for 30  seconds. Slowly release the stretch. Repeat 2 times. Complete this exercise 3 times per week. STRENGTHENING EXERCISES These exercises build strength and endurance in your elbow and forearm. Endurance is the ability to use your muscles for a long time, even after they get tired. Exercise K: Elbow Flexion, Isometric  Stand or sit up straight. Bend your left / right elbow in an "L" shape (90 degrees) and turn your palm up so your forearm is at the height of your waist. Place your other hand on top of your forearm. Gently push down as your left / right arm resists. Push as hard as you can with both arms without causing any pain or movement at your left / right elbow. Hold this position for 3 seconds. Slowly release the tension in both arms. Let your muscles relax completely before repeating. Repeat 2 times. Complete this exercise 3 times per week. Exercise L: Elbow Extensors, Isometric  Stand or sit up straight. Place your left / right arm  so your palm faces your abdomen and it is at the height of your waist. Place your other hand on the underside of your forearm. Gently push up as your left / right arm resists. Push as hard as you can with both arms, without causing any pain or movement at your left / right elbow. Hold this position for 3 seconds. Slowly release the tension in both arms. Let your muscles relax completely before repeating. Repeat _______2___ times. Complete this exercise 3 times per week. Exercise M: Elbow Flexion With Forearm Palm Up  Sit upright on a firm chair without armrests, or stand. Place your left / right arm at your side with your palm facing forward. Holding a 5 lbweight or gripping a rubber exercise band or tubing, bend your elbow to bring your hand toward your shoulder. Hold this position for 3 seconds. Slowly return to the starting position. Repeat 2 times. Complete this exercise 3 times per week. Exercise N: Elbow Extension  Sit on a firm chair without  armrests, or stand. Keeping your upper arms at your sides, bring both hands up toward your left / right shoulder while you grip a rubber exercise band or tubing. Your left / right hand should be just below the other hand. Straighten your left / right elbow. Hold this position for 3 seconds. Control the resistance of the band or tubing as your hand returns to your side. Repeat 2 times. Complete this exercise 3 times per week. Exercise O: Forearm Rotation, Supination  Sit with your left / right forearm supported on a table. Keep your elbow at waist height. Rest your hand over the edge of the table with your palm facing down. Gently hold a lightweight hammer. Without moving your elbow, slowly rotate your forearm to turn your palm and hand upward to a "thumbs-up" position. Hold this position for 3 seconds. Slowly return to the starting position. Repeat 2 times. Complete this exercise 3 times per week. Exercise P: Forearm Rotation, Pronation  Sit with your left / right forearm supported on a table. Keep your elbow below shoulder height. Rest your hand over the edge of the table with your palm facing up. Gently hold a lightweight hammer. Without moving your elbow, slowly rotate your forearm to turn your palm and hand upward to a "thumbs-up" position. Hold this position for 3 seconds. Slowly return to the starting position. Repeat 2 times. Complete this exercise 3 times per week.  Make sure you discuss any questions you have with your health care provider. Document Released: 12/31/2004 Document Revised: 06/27/2015 Document Reviewed: 11/11/2014 Elsevier Interactive Patient Education  [employer].

## 2023-05-07 NOTE — Progress Notes (Signed)
 Chief Complaint  Patient presents with   Medical Management of Chronic Issues    Patient presents today for a 6 month follow-up   Quality Metric Gaps    Colonoscopy,pneumococcal vaccine, lung cancer screen    Subjective Charles Hudson is a 63 y.o. male who presents for hypertension follow up. He does not monitor home blood pressures. He is compliant with medication- Norvasc 10 mg/d. Patient has these side effects of medication: none He is sometimes adhering to a healthy diet overall. Current exercise: active at work No CP or SOB.   Hyperlipidemia Patient presents for dyslipidemia follow up. Currently being treated with Lipitor 20 mg/d and compliance with treatment thus far has been good. He denies myalgias. Diet/exercise as above.  The patient is not known to have coexisting coronary artery disease.  Several mo of R outer elbow pain. No inj or change in activity. Does not play tennis. Has not tried anything at home. No bruising, redness, or swelling.    Past Medical History:  Diagnosis Date   Essential hypertension    Hyperlipidemia     Exam BP 134/76   Pulse 81   Temp 97.6 F (36.4 C)   Resp 20   Ht 5\' 6"  (1.676 m)   Wt 160 lb 3.2 oz (72.7 kg)   SpO2 97%   BMI 25.86 kg/m  General:  well developed, well nourished, in no apparent distress Heart: RRR, no bruits, no LE edema Lungs: clear to auscultation, no accessory muscle use MSK: +TTP over R lat epicondyle. Pain w resisted dorsiflexion of R wrist.  Psych: well oriented with normal range of affect and appropriate judgment/insight  Essential hypertension  Pure hypercholesterolemia - Plan: Comprehensive metabolic panel, Lipid panel  Lateral epicondylitis of right elbow  Chronic, stable. Cont Norvasc 10 mg/d. Counseled on diet and exercise. Chronic, stable. Lipitor 20 mg/d.  Forearm strap, ice, Tylenol, stretches/exercises. F/u prn for this.  GI info provided in AVS.  PCV20 discussed. Will consider in next 6-12  mo.  F/u in 6 mo. The patient voiced understanding and agreement to the plan.  Jilda Roche [place], DO 05/07/23  1:58 PM

## 2023-05-21 ENCOUNTER — Other Ambulatory Visit: Payer: Self-pay | Admitting: Family Medicine

## 2023-06-02 ENCOUNTER — Encounter: Payer: Self-pay | Admitting: Family

## 2023-06-04 ENCOUNTER — Ambulatory Visit (AMBULATORY_SURGERY_CENTER)

## 2023-06-04 ENCOUNTER — Encounter

## 2023-06-04 ENCOUNTER — Encounter: Payer: Self-pay | Admitting: Gastroenterology

## 2023-06-04 VITALS — Ht 66.0 in | Wt 160.0 lb

## 2023-06-04 DIAGNOSIS — Z860100 Personal history of colon polyps, unspecified: Secondary | ICD-10-CM

## 2023-06-04 MED ORDER — NA SULFATE-K SULFATE-MG SULF 17.5-3.13-1.6 GM/177ML PO SOLN
1.0000 | Freq: Once | ORAL | 0 refills | Status: AC
Start: 2023-06-04 — End: 2023-06-04

## 2023-06-04 NOTE — Progress Notes (Signed)

## 2023-06-23 ENCOUNTER — Encounter: Payer: Self-pay | Admitting: Gastroenterology

## 2023-06-25 ENCOUNTER — Encounter: Payer: Self-pay | Admitting: Gastroenterology

## 2023-06-25 ENCOUNTER — Ambulatory Visit: Admitting: Gastroenterology

## 2023-06-25 VITALS — BP 122/70 | HR 58 | Temp 97.9°F | Resp 13 | Ht 66.0 in | Wt 160.0 lb

## 2023-06-25 DIAGNOSIS — Z1211 Encounter for screening for malignant neoplasm of colon: Secondary | ICD-10-CM

## 2023-06-25 DIAGNOSIS — K640 First degree hemorrhoids: Secondary | ICD-10-CM

## 2023-06-25 DIAGNOSIS — Z860101 Personal history of adenomatous and serrated colon polyps: Secondary | ICD-10-CM | POA: Diagnosis not present

## 2023-06-25 DIAGNOSIS — K641 Second degree hemorrhoids: Secondary | ICD-10-CM

## 2023-06-25 DIAGNOSIS — Z860100 Personal history of colon polyps, unspecified: Secondary | ICD-10-CM

## 2023-06-25 DIAGNOSIS — K5730 Diverticulosis of large intestine without perforation or abscess without bleeding: Secondary | ICD-10-CM

## 2023-06-25 MED ORDER — SODIUM CHLORIDE 0.9 % IV SOLN: 500.0000 mL | INTRAVENOUS | Status: DC

## 2023-06-25 NOTE — Progress Notes (Signed)
 Patient states there have been no changes to medical or surgical history since time of pre-visit.

## 2023-06-25 NOTE — Progress Notes (Signed)
 GASTROENTEROLOGY PROCEDURE H&P NOTE   Primary Care Physician: Jobe Mulder, DO    Reason for Procedure:  Colon polyp surveillance  Plan:    Colonoscopy  Patient is appropriate for endoscopic procedure(s) in the ambulatory ([facility]) setting.  The nature of the procedure, as well as the risks, benefits, and alternatives were carefully and thoroughly reviewed with the patient. Ample time for discussion and questions allowed. The patient understood, was satisfied, and agreed to proceed.     HPI: Charles Hudson is a 63 y.o. male who presents for colonoscopy for ongoing colon polyp surveillance and colon cancer screening.  No active GI symptoms.  No known family history of colon cancer or related malignancy.  Patient is otherwise without complaints or active issues today.  Last colonoscopy was 03/2018 and notable for 2 small (2-4 mm) adenomatous polyps, sigmoid diverticulosis, with recommendation to repeat in 5 years.  Past Medical History:  Diagnosis Date   Essential hypertension    Hyperlipidemia     Past Surgical History:  Procedure Laterality Date   KNEE ARTHROSCOPY  1991   KNEE ARTHROSCOPY     WISDOM TOOTH EXTRACTION      Prior to Admission medications   Medication Sig Start Date End Date Taking? Authorizing Provider  amLODipine  (NORVASC ) 10 MG tablet Take 1 tablet by mouth once daily 05/21/23   Jobe Mulder, DO  aspirin EC 81 MG tablet Take 81 mg by mouth daily. Swallow whole.    [provider]  atorvastatin  (LIPITOR) 20 MG tablet Take 1 tablet (20 mg total) by mouth daily. 11/06/22   Jobe Mulder, DO  cyanocobalamin (VITAMIN B12) 1000 MCG/ML injection Inject 1,000 mcg into the muscle every 30 (thirty) days.    [provider]  diphenhydrAMINE (BENADRYL) 25 mg capsule Take by mouth.    [provider]  folic acid (FOLVITE) 400 MCG tablet Take 400 mcg by mouth daily.    [provider]  tadalafil  (CIALIS ) 20 MG  tablet Take 0.5-1 tablets (10-20 mg total) by mouth every other day as needed for erectile dysfunction. 05/07/23   Jobe Mulder, DO    Current Outpatient Medications  Medication Sig Dispense Refill   amLODipine  (NORVASC ) 10 MG tablet Take 1 tablet by mouth once daily 90 tablet 0   aspirin EC 81 MG tablet Take 81 mg by mouth daily. Swallow whole.     atorvastatin  (LIPITOR) 20 MG tablet Take 1 tablet (20 mg total) by mouth daily. 90 tablet 3   cyanocobalamin (VITAMIN B12) 1000 MCG/ML injection Inject 1,000 mcg into the muscle every 30 (thirty) days.     diphenhydrAMINE (BENADRYL) 25 mg capsule Take by mouth.     folic acid (FOLVITE) 400 MCG tablet Take 400 mcg by mouth daily.     tadalafil  (CIALIS ) 20 MG tablet Take 0.5-1 tablets (10-20 mg total) by mouth every other day as needed for erectile dysfunction. 30 tablet 2   No current facility-administered medications for this visit.    Allergies as of 06/25/2023   (No Known Allergies)    Family History  Problem Relation Age of Onset   Alzheimer's disease Father    Colon cancer Neg Hx    Colon polyps Neg Hx    Esophageal cancer Neg Hx    Rectal cancer Neg Hx    Stomach cancer Neg Hx     Social History   Socioeconomic History   Marital status: Married    Spouse name: Not on file  Number of children: 2   Years of education: Not on file   Highest education level: Not on file  Occupational History   Occupation: Physiological scientist  Tobacco Use   Smoking status: Former    Current packs/day: 0.00    Average packs/day: 1.5 packs/day for 30.0 years (45.0 ttl pk-yrs)    Types: Cigarettes    Start date: 01/04/1988    Quit date: 01/03/2018    Years since quitting: 5.4   Smokeless tobacco: Never  Vaping Use   Vaping status: Never Used  Substance and Sexual Activity   Alcohol use: Not Currently    Alcohol/week: 28.0 standard drinks of alcohol    Types: 28 Cans of beer per week   Drug use: No   Sexual activity: Yes     Partners: Female  Other Topics Concern   Not on file  Social History Narrative   Not on file   Social Drivers of Health   Financial Resource Strain: Low Risk  (12/27/2017)   Overall Financial Resource Strain (CARDIA)    Difficulty of Paying Living Expenses: Not hard at all  Food Insecurity: Low Risk  (02/26/2023)   Received from [facility]   Hunger Vital Sign    Worried About Running Out of Food in the Last Year: Never true    Ran Out of Food in the Last Year: Never true  Transportation Needs: No Transportation Needs (02/26/2023)   Received from [facility]    In the past 12 months, has lack of reliable transportation kept you from medical appointments, meetings, work or from getting things needed for daily living? : No  Physical Activity: Unknown (12/27/2017)   Exercise Vital Sign    Days of Exercise per Week: 0 days    Minutes of Exercise per Session: Not on file  Stress: Not on file  Social Connections: Somewhat Isolated (12/27/2017)   Social Connection and Isolation Panel [NHANES]    Frequency of Communication with Friends and Family: Three times a week    Frequency of Social Gatherings with Friends and Family: Never    Attends Religious Services: Never    Database administrator or Organizations: No    Attends Banker Meetings: Never    Marital Status: Married  Catering manager Violence: Not on file    Physical Exam: Vital signs in last 24 hours: @There  were no vitals taken for this visit. GEN: NAD EYE: Sclerae anicteric ENT: MMM CV: Non-tachycardic Pulm: CTA b/l GI: Soft, NT/ND NEURO:  Alert & Oriented x 3   Harry Lindau, DO [facility]   06/25/2023 7:42 AM

## 2023-06-25 NOTE — Progress Notes (Signed)
 Pt A/O x 3, gd SR's, pleased with anesthesia, report to RN

## 2023-06-25 NOTE — Patient Instructions (Signed)
 Resume previous diet Continue present medications Repeat colonoscopy in 10 years Return to GI office as needed YOU HAD AN ENDOSCOPIC PROCEDURE TODAY AT THE [facility]:   Refer to the procedure report that was given to you for any specific questions about what was found during the examination.  If the procedure report does not answer your questions, please call your gastroenterologist to clarify.  If you requested that your care partner not be given the details of your procedure findings, then the procedure report has been included in a sealed envelope for you to review at your convenience later.  YOU SHOULD EXPECT: Some feelings of bloating in the abdomen. Passage of more gas than usual.  Walking can help get rid of the air that was put into your GI tract during the procedure and reduce the bloating. If you had a lower endoscopy (such as a colonoscopy or flexible sigmoidoscopy) you may notice spotting of blood in your stool or on the toilet paper. If you underwent a bowel prep for your procedure, you may not have a normal bowel movement for a few days.  Please Note:  You might notice some irritation and congestion in your nose or some drainage.  This is from the oxygen used during your procedure.  There is no need for concern and it should clear up in a day or so. SYMPTOMS TO REPORT IMMEDIATELY:  Following lower endoscopy (colonoscopy or flexible sigmoidoscopy):  Excessive amounts of blood in the stool  Significant tenderness or worsening of abdominal pains  Swelling of the abdomen that is new, acute  Fever of 100F or higher  For urgent or emergent issues, a gastroenterologist can be reached at any hour by calling [phone]. Do not use MyChart messaging for urgent concerns.    DIET:  We do recommend a small meal at first, but then you may proceed to your regular diet.  Drink plenty of fluids but you should avoid alcoholic beverages for 24 hours.  ACTIVITY:  You should plan  to take it easy for the rest of today and you should NOT DRIVE or use heavy machinery until tomorrow (because of the sedation medicines used during the test).    FOLLOW UP: Our staff will call the number listed on your records the next business day following your procedure.  We will call around 7:15- 8:00 am to check on you and address any questions or concerns that you may have regarding the information given to you following your procedure. If we do not reach you, we will leave a message.     If any biopsies were taken you will be contacted by phone or by letter within the next 1-3 weeks.  Please call us  at [phone] if you have not heard about the biopsies in 3 weeks.    SIGNATURES/CONFIDENTIALITY: You and/or your care partner have signed paperwork which will be entered into your electronic medical record.  These signatures attest to the fact that that the information above on your After Visit Summary has been reviewed and is understood.  Full responsibility of the confidentiality of this discharge information lies with you and/or your care-partner.

## 2023-06-25 NOTE — Op Note (Signed)
 [facility] Patient Name: Charles Hudson Procedure Date: 06/25/2023 8:03 AM MRN: [id number] Endoscopist: Harry Lindau , MD, [id number] Age: 63 Referring MD:  Date of Birth: 1960-10-01 Gender: Male Account #: [id number] Procedure:                Colonoscopy Indications:              Surveillance: Personal history of adenomatous                            polyps on last colonoscopy 5 years ago                           Last colonoscopy was 03/2018 and notable for 2 small                            (2-4 mm) adenomatous polyps, sigmoid                            diverticulosis. He is otherwise without GI symptoms                            and no family history of colon cancer. Medicines:                Monitored Anesthesia Care Procedure:                Pre-Anesthesia Assessment:                           - Prior to the procedure, a History and Physical                            was performed, and patient medications and                            allergies were reviewed. The patient's tolerance of                            previous anesthesia was also reviewed. The risks                            and benefits of the procedure and the sedation                            options and risks were discussed with the patient.                            All questions were answered, and informed consent                            was obtained. Prior Anticoagulants: The patient has                            taken no anticoagulant or antiplatelet agents. ASA  Grade Assessment: II - A patient with mild systemic                            disease. After reviewing the risks and benefits,                            the patient was deemed in satisfactory condition to                            undergo the procedure.                           After obtaining informed consent, the colonoscope                            was passed under direct vision. Throughout the                             procedure, the patient's blood pressure, pulse, and                            oxygen saturations were monitored continuously. The                            Olympus Scope SN [id number] was introduced through the                            anus and advanced to the the cecum, identified by                            appendiceal orifice and ileocecal valve. The                            colonoscopy was performed without difficulty. The                            patient tolerated the procedure well. The quality                            of the bowel preparation was good. The ileocecal                            valve, appendiceal orifice, and rectum were                            photographed. Scope In: 8:17:08 AM Scope Out: 8:26:36 AM Scope Withdrawal Time: 0 hours 7 minutes 59 seconds  Total Procedure Duration: 0 hours 9 minutes 28 seconds  Findings:                 The perianal and digital rectal examinations were                            normal.  A few small-mouthed diverticula were found in the                            sigmoid colon.                           Non-bleeding internal hemorrhoids were found during                            retroflexion. The hemorrhoids were small and Grade                            II (internal hemorrhoids that prolapse but reduce                            spontaneously).                           The exam was otherwise normal throughout the                            remainder of the colon. Complications:            No immediate complications. Estimated Blood Loss:     Estimated blood loss: none. Impression:               - Diverticulosis in the sigmoid colon.                           - Non-bleeding internal hemorrhoids.                           - No specimens collected. Recommendation:           - Patient has a contact number available for                            emergencies. The signs  and symptoms of potential                            delayed complications were discussed with the                            patient. Return to normal activities tomorrow.                            Written discharge instructions were provided to the                            patient.                           - Resume previous diet.                           - Continue present medications.                           -  Repeat colonoscopy in 10 years for surveillance.                           - Return to GI office PRN. Harry Lindau, MD 06/25/2023 8:31:02 AM

## 2023-06-28 ENCOUNTER — Telehealth: Payer: Self-pay

## 2023-06-28 NOTE — Telephone Encounter (Signed)
  Follow up Call-     06/25/2023    7:46 AM  Call back number  Post procedure Call Back phone  # [phone]  Permission to leave phone message Yes     Patient questions:  Do you have a fever, pain , or abdominal swelling? No. Pain Score  0 *  Have you tolerated food without any problems? Yes.    Have you been able to return to your normal activities? Yes.    Do you have any questions about your discharge instructions: Diet   No. Medications  No. Follow up visit  No.  Do you have questions or concerns about your Care? No.  Actions: * If pain score is 4 or above: No action needed, pain <4.

## 2023-08-14 ENCOUNTER — Other Ambulatory Visit: Payer: Self-pay | Admitting: Family Medicine

## 2023-10-31 ENCOUNTER — Other Ambulatory Visit: Payer: Self-pay | Admitting: Family Medicine

## 2023-11-05 ENCOUNTER — Encounter: Payer: Self-pay | Admitting: Family

## 2023-11-09 ENCOUNTER — Encounter: Payer: Self-pay | Admitting: Family

## 2023-11-10 ENCOUNTER — Other Ambulatory Visit: Payer: Self-pay | Admitting: Family Medicine

## 2023-11-12 ENCOUNTER — Ambulatory Visit (INDEPENDENT_AMBULATORY_CARE_PROVIDER_SITE_OTHER): Admitting: Family Medicine

## 2023-11-12 ENCOUNTER — Encounter: Payer: Self-pay | Admitting: Family Medicine

## 2023-11-12 ENCOUNTER — Ambulatory Visit: Payer: Self-pay | Admitting: Family Medicine

## 2023-11-12 VITALS — BP 122/72 | HR 73 | Temp 97.6°F | Resp 16 | Ht 66.0 in | Wt 157.6 lb

## 2023-11-12 DIAGNOSIS — Z23 Encounter for immunization: Secondary | ICD-10-CM | POA: Diagnosis not present

## 2023-11-12 DIAGNOSIS — Z0000 Encounter for general adult medical examination without abnormal findings: Secondary | ICD-10-CM | POA: Diagnosis not present

## 2023-11-12 LAB — COMPREHENSIVE METABOLIC PANEL WITH GFR
ALT: 19 U/L (ref 0–53)
AST: 20 U/L (ref 0–37)
Albumin: 4.6 g/dL (ref 3.5–5.2)
Alkaline Phosphatase: 62 U/L (ref 39–117)
BUN: 11 mg/dL (ref 6–23)
CO2: 31 meq/L (ref 19–32)
Calcium: 9.4 mg/dL (ref 8.4–10.5)
Chloride: 101 meq/L (ref 96–112)
Creatinine, Ser: 1 mg/dL (ref 0.40–1.50)
GFR: 81.53 mL/min (ref >60.00)
Glucose, Bld: 97 mg/dL (ref 70–99)
Potassium: 4.2 meq/L (ref 3.5–5.1)
Sodium: 138 meq/L (ref 135–145)
Total Bilirubin: 0.9 mg/dL (ref 0.2–1.2)
Total Protein: 7.5 g/dL (ref 6.0–8.3)

## 2023-11-12 LAB — LIPID PANEL
Cholesterol: 121 mg/dL (ref 0–200)
HDL: 48 mg/dL (ref >39.00)
LDL Cholesterol: 61 mg/dL (ref 0–99)
NonHDL: 72.89
Total CHOL/HDL Ratio: 3
Triglycerides: 61 mg/dL (ref 0.0–149.0)
VLDL: 12.2 mg/dL (ref 0.0–40.0)

## 2023-11-12 LAB — CBC
HCT: 45.6 % (ref 39.0–52.0)
Hemoglobin: 15.4 g/dL (ref 13.0–17.0)
MCHC: 33.7 g/dL (ref 30.0–36.0)
MCV: 91.3 fl (ref 78.0–100.0)
Platelets: 309 K/uL (ref 150.0–400.0)
RBC: 5 Mil/uL (ref 4.22–5.81)
RDW: 13.2 % (ref 11.5–15.5)
WBC: 6.6 K/uL (ref 4.0–10.5)

## 2023-11-12 MED ORDER — AMLODIPINE BESYLATE 10 MG PO TABS: 10.0000 mg | ORAL_TABLET | Freq: Every day | ORAL | 3 refills | Status: AC

## 2023-11-12 MED ORDER — ATORVASTATIN CALCIUM 20 MG PO TABS: 20.0000 mg | ORAL_TABLET | Freq: Every day | ORAL | 3 refills | Status: AC

## 2023-11-12 NOTE — Patient Instructions (Signed)
 Give us  2-3 business days to get the results of your labs back.   Keep the diet clean and stay active.  Let us  know if you need anything.

## 2023-11-12 NOTE — Progress Notes (Signed)
 Chief Complaint  Patient presents with   Annual Exam    CPE    Well Male Charles Hudson is here for a complete physical.   His last physical was >1 year ago.  Current diet: in general, a healthy diet.  Current exercise: walking, active at work Weight trend: stable Fatigue out of ordinary? No. Seat belt? Yes.   Advanced directive? Yes  Health maintenance Shingrix - Yes Colonoscopy- Yes Tetanus- Yes HIV- Yes Hep C- Yes   Past Medical History:  Diagnosis Date   Essential hypertension    Hyperlipidemia      Past Surgical History:  Procedure Laterality Date   KNEE ARTHROSCOPY  1991   KNEE ARTHROSCOPY     WISDOM TOOTH EXTRACTION      Medications  Current Outpatient Medications on File Prior to Visit  Medication Sig Dispense Refill   amLODipine  (NORVASC ) 10 MG tablet Take 1 tablet by mouth once daily 90 tablet 0   aspirin EC 81 MG tablet Take 81 mg by mouth daily. Swallow whole.     atorvastatin  (LIPITOR) 20 MG tablet Take 1 tablet by mouth once daily 90 tablet 0   cyanocobalamin (VITAMIN B12) 1000 MCG/ML injection Inject 1,000 mcg into the muscle every 30 (thirty) days.     folic acid (FOLVITE) 400 MCG tablet Take 400 mcg by mouth daily.     tadalafil  (CIALIS ) 20 MG tablet Take 0.5-1 tablets (10-20 mg total) by mouth every other day as needed for erectile dysfunction. 30 tablet 2   Allergies No Known Allergies  Family History Family History  Problem Relation Age of Onset   Alzheimer's disease Father    Colon cancer Neg Hx    Colon polyps Neg Hx    Esophageal cancer Neg Hx    Rectal cancer Neg Hx    Stomach cancer Neg Hx     Review of Systems: Constitutional:  no fevers Eye:  no recent significant change in vision Ear/Nose/Mouth/Throat:  Ears:  no hearing loss Nose/Mouth/Throat:  no complaints of nasal congestion, no sore throat Cardiovascular:  no chest pain Respiratory:  no shortness of breath Gastrointestinal:  no change in bowel habits GU:  Male:  negative for dysuria, frequency Musculoskeletal/Extremities:  no joint pain Integumentary (Skin/Breast):  no abnormal skin lesions reported Neurologic:  no headaches Endocrine: No unexpected weight changes Hematologic/Lymphatic:  no abnormal bleeding  Exam BP 122/72 (BP Location: Left Arm, Patient Position: Sitting)   Pulse 73   Temp 97.6 F (36.4 C) (Oral)   Resp 16   Ht 5' 6 (1.676 m)   Wt 157 lb 9.6 oz (71.5 kg)   SpO2 96%   BMI 25.44 kg/m  General:  well developed, well nourished, in no apparent distress Skin:  no significant moles, warts, or growths Head:  no masses, lesions, or tenderness Eyes:  pupils equal and round, sclera anicteric without injection Ears:  canals without lesions, TMs shiny without retraction, no obvious effusion, no erythema Nose:  nares patent, mucosa normal Throat/Pharynx:  lips and gingiva without lesion; tongue and uvula midline; non-inflamed pharynx; no exudates or postnasal drainage Neck: neck supple without adenopathy, thyromegaly, or masses Cardiac: RRR, no bruits, no LE edema Lungs:  clear to auscultation, breath sounds equal bilaterally, no respiratory distress Abdomen: BS+, soft, non-tender, non-distended, no masses or organomegaly noted Rectal: Deferred Musculoskeletal:  symmetrical muscle groups noted without atrophy or deformity Neuro:  gait normal; deep tendon reflexes normal and symmetric Psych: well oriented with normal range of affect and appropriate  judgment/insight  Assessment and Plan  Well adult exam - Plan: CBC, Comprehensive metabolic panel with GFR, Lipid panel   Well 63 y.o. male. Counseled on diet and exercise. Counseled on risks and benefits of prostate cancer screening with PSA. The patient follows w urology.  PCV20 today.  Immunizations, labs, and further orders as above. Follow up in 6 mo. The patient voiced understanding and agreement to the plan.  Mabel Mt Rudyard, DO 11/12/23 8:30 AM

## 2023-11-12 NOTE — Addendum Note (Signed)
 Addended by: Archie Atilano M on: 11/12/2023 08:47 AM   Modules accepted: Orders

## 2023-12-31 ENCOUNTER — Ambulatory Visit (HOSPITAL_BASED_OUTPATIENT_CLINIC_OR_DEPARTMENT_OTHER)
Admission: RE | Admit: 2023-12-31 | Discharge: 2023-12-31 | Disposition: A | Discharge status: Home / Self Care | Payer: Self-pay | Source: Ambulatory Visit | Attending: Acute Care | Admitting: Acute Care

## 2023-12-31 DIAGNOSIS — Z122 Encounter for screening for malignant neoplasm of respiratory organs: Secondary | ICD-10-CM | POA: Insufficient documentation

## 2023-12-31 DIAGNOSIS — Z87891 Personal history of nicotine dependence: Secondary | ICD-10-CM | POA: Insufficient documentation

## 2024-01-12 ENCOUNTER — Ambulatory Visit: Payer: Self-pay

## 2024-01-12 ENCOUNTER — Telehealth: Payer: Self-pay

## 2024-01-12 NOTE — Telephone Encounter (Signed)
 FYI Only or Action Required?: FYI only for provider: appointment scheduled on 11/14.  Patient was last seen in primary care on 11/12/2023 by Frann Mabel Mt, DO.  Called Nurse Triage reporting Cyst.  Symptoms began a week ago.  Interventions attempted: Nothing.  Symptoms are: unchanged.  Triage Disposition: See PCP When Office is Open (Within 3 Days)  Patient/caregiver understands and will follow disposition?: Yes       Copied from CRM #[id number]. Topic: Clinical - Red Word Triage >> Jan 12, 2024  9:35 AM Treva T wrote: [facility] that prompted transfer to Nurse Triage: Patient spouse spouse, Reena, calling states patient has a painful knot/cyst on left upper forearm with some sensitivity and pain with certain movements.   Requesting an appointment with provider, would like to have knot/cyst evaluated. Reason for Disposition  [1] Small swelling or lump AND [2] unexplained AND [3] present > 1 week  Answer Assessment - Initial Assessment Questions 1. APPEARANCE:  What does it look like?      Under the skin, no redness noted   2. SIZE: How large is the swelling? (e.g., inches, cm; or compare to size of pinhead, tip of pen, eraser, coin, pea, grape, ping pong ball)       Egg sized   3. LOCATION: painful knot/cyst on left upper forearm   4. ONSET: x 1 week        5. COLOR: What color is it? Is there more than one color?     Skin tone   6. PAIN: Is there any pain? If Yes, ask: How bad is the pain? (Scale 1-10; or mild, moderate, severe)        Intermittent pain with touch- mild  7. ITCH: Does it itch? If Yes, ask: How bad is the itch?     No   9 OTHER SYMPTOMS: Do you have any other symptoms? (e.g., fever)  No other symptoms noted, no drainage, or redness noted. Appointment scheduled for evaluation on 11/14 (patient request). Patient agrees with plan of care, and will call back if anything changes, or if symptoms worsen.  Protocols used: Skin  Lump or Localized Swelling-A-AH

## 2024-01-12 NOTE — Telephone Encounter (Signed)
 Called spoke with pt wife and his schedule with Dr.Lowne for Friday for knot/cyst on left upper forearm.

## 2024-01-14 ENCOUNTER — Encounter: Payer: Self-pay | Admitting: Family Medicine

## 2024-01-14 ENCOUNTER — Ambulatory Visit: Admitting: Family Medicine

## 2024-01-14 VITALS — BP 120/80 | HR 74 | Temp 98.0°F | Resp 16 | Ht 66.0 in | Wt 163.6 lb

## 2024-01-14 DIAGNOSIS — M25512 Pain in left shoulder: Secondary | ICD-10-CM | POA: Diagnosis not present

## 2024-01-14 DIAGNOSIS — R2232 Localized swelling, mass and lump, left upper limb: Secondary | ICD-10-CM | POA: Diagnosis not present

## 2024-01-14 DIAGNOSIS — G8929 Other chronic pain: Secondary | ICD-10-CM | POA: Diagnosis not present

## 2024-01-14 NOTE — Progress Notes (Signed)
 Subjective:    Patient ID: Charles Hudson, male    DOB: 07/13/1960, 63 y.o.   MRN: [id number]  Chief Complaint  Patient presents with   Mass    Pt states having a bump in the left deltoid. No pain, redness or swelling Pt did get a pneum shot there back in sept    HPI Patient is in today for cyst on arm.  Discussed the use of AI scribe software for clinical note transcription with the patient, who gave verbal consent to proceed.  History of Present Illness Charles Hudson is a 63 year old male who presents with a palpable knot on his body.  He noticed a palpable knot on his body while in the shower. The knot is not painful, and he compares it to a lipoma. He initially thought it might be related to the pneumonia shot he received during his physical in September.  He experiences occasional shoulder pain, particularly when lifting. He works in materials engineer, which involves a lot of lifting, and he wonders if his shoulder pain is related to his work or possibly his sleeping position, as he sleeps on both sides. The pain is not constant and does not always occur with movement.  He had a yearly lung scan on Halloween and has not yet received the results by mail, although he was informed that a letter was sent out two days ago. He does not use MyChart for receiving medical results.    Past Medical History:  Diagnosis Date   Essential hypertension    Hyperlipidemia     Past Surgical History:  Procedure Laterality Date   KNEE ARTHROSCOPY  1991   KNEE ARTHROSCOPY     WISDOM TOOTH EXTRACTION      Family History  Problem Relation Age of Onset   Alzheimer's disease Father    Colon cancer Neg Hx    Colon polyps Neg Hx    Esophageal cancer Neg Hx    Rectal cancer Neg Hx    Stomach cancer Neg Hx     Social History   Socioeconomic History   Marital status: Married    Spouse name: Not on file   Number of children: 2   Years of education: Not on file   Highest  education level: Not on file  Occupational History   Occupation: physiological scientist  Tobacco Use   Smoking status: Former    Current packs/day: 0.00    Average packs/day: 1.5 packs/day for 30.0 years (45.0 ttl pk-yrs)    Types: Cigarettes    Start date: 01/04/1988    Quit date: 01/03/2018    Years since quitting: 6.0   Smokeless tobacco: Never  Vaping Use   Vaping status: Never Used  Substance and Sexual Activity   Alcohol use: Not Currently    Alcohol/week: 28.0 standard drinks of alcohol    Types: 28 Cans of beer per week   Drug use: No   Sexual activity: Yes    Partners: Female  Other Topics Concern   Not on file  Social History Narrative   Not on file   Social Drivers of Health   Financial Resource Strain: Low Risk  (12/27/2017)   Overall Financial Resource Strain (CARDIA)    Difficulty of Paying Living Expenses: Not hard at all  Food Insecurity: Low Risk  (02/26/2023)   Received from [facility]   Hunger Vital Sign    Within the past 12 months, you worried that your food would run out  before you got money to buy more: Never true    Within the past 12 months, the food you bought just didn't last and you didn't have money to get more. : Never true  Transportation Needs: No Transportation Needs (02/26/2023)   Received from [facility]    In the past 12 months, has lack of reliable transportation kept you from medical appointments, meetings, work or from getting things needed for daily living? : No  Physical Activity: Unknown (12/27/2017)   Exercise Vital Sign    Days of Exercise per Week: 0 days    Minutes of Exercise per Session: Not on file  Stress: Not on file  Social Connections: Somewhat Isolated (12/27/2017)   Social Connection and Isolation Panel    Frequency of Communication with Friends and Family: Three times a week    Frequency of Social Gatherings with Friends and Family: Never    Attends Religious Services: Never    Automotive Engineer or Organizations: No    Attends Banker Meetings: Never    Marital Status: Married  Catering Manager Violence: Not on file    Outpatient Medications Prior to Visit  Medication Sig Dispense Refill   amLODipine  (NORVASC ) 10 MG tablet Take 1 tablet (10 mg total) by mouth daily. 90 tablet 3   aspirin EC 81 MG tablet Take 81 mg by mouth daily. Swallow whole.     atorvastatin  (LIPITOR) 20 MG tablet Take 1 tablet (20 mg total) by mouth daily. 90 tablet 3   cyanocobalamin (VITAMIN B12) 1000 MCG/ML injection Inject 1,000 mcg into the muscle every 30 (thirty) days.     folic acid (FOLVITE) 400 MCG tablet Take 400 mcg by mouth daily.     tadalafil  (CIALIS ) 20 MG tablet Take 0.5-1 tablets (10-20 mg total) by mouth every other day as needed for erectile dysfunction. 30 tablet 2   No facility-administered medications prior to visit.    No Known Allergies  Review of Systems  Constitutional:  Negative for fever and malaise/fatigue.  HENT:  Negative for congestion.   Eyes:  Negative for blurred vision.  Respiratory:  Negative for cough and shortness of breath.   Cardiovascular:  Negative for chest pain, palpitations and leg swelling.  Gastrointestinal:  Negative for vomiting.  Musculoskeletal:  Negative for back pain.  Skin:  Negative for rash.  Neurological:  Negative for loss of consciousness and headaches.       Objective:    Physical Exam Vitals and nursing note reviewed.  Constitutional:      General: He is not in acute distress.    Appearance: Normal appearance. He is well-developed.  HENT:     Head: Normocephalic and atraumatic.  Eyes:     General: No scleral icterus.       Right eye: No discharge.        Left eye: No discharge.  Cardiovascular:     Rate and Rhythm: Normal rate and regular rhythm.     Heart sounds: No murmur heard. Pulmonary:     Effort: Pulmonary effort is normal. No respiratory distress.     Breath sounds: Normal breath sounds.   Musculoskeletal:        General: Normal range of motion.     Cervical back: Normal range of motion and neck supple.     Right lower leg: No edema.     Left lower leg: No edema.  Skin:    General: Skin is warm and dry.  Comments: Small nodules upper Left arm --- pea sized ? Lipoma   Neurological:     Mental Status: He is alert and oriented to person, place, and time.  Psychiatric:        Mood and Affect: Mood normal.        Behavior: Behavior normal.        Thought Content: Thought content normal.        Judgment: Judgment normal.     BP 120/80 (BP Location: Right Arm, Patient Position: Sitting, Cuff Size: Normal)   Pulse 74   Temp 98 F (36.7 C) (Oral)   Resp 16   Ht 5' 6 (1.676 m)   Wt 163 lb 9.6 oz (74.2 kg)   SpO2 97%   BMI 26.41 kg/m  Wt Readings from Last 3 Encounters:  01/14/24 163 lb 9.6 oz (74.2 kg)  11/12/23 157 lb 9.6 oz (71.5 kg)  06/25/23 160 lb (72.6 kg)    Diabetic Foot Exam - Simple   No data filed    Lab Results  Component Value Date   WBC 6.6 11/12/2023   HGB 15.4 11/12/2023   HCT 45.6 11/12/2023   PLT 309.0 11/12/2023   GLUCOSE 97 11/12/2023   CHOL 121 11/12/2023   TRIG 61.0 11/12/2023   HDL 48.00 11/12/2023   LDLCALC 61 11/12/2023   ALT 19 11/12/2023   AST 20 11/12/2023   NA 138 11/12/2023   K 4.2 11/12/2023   CL 101 11/12/2023   CREATININE 1.00 11/12/2023   BUN 11 11/12/2023   CO2 31 11/12/2023   PSA 2.15 11/14/2021   HGBA1C 5.2 11/14/2021    No results found for: TSH Lab Results  Component Value Date   WBC 6.6 11/12/2023   HGB 15.4 11/12/2023   HCT 45.6 11/12/2023   MCV 91.3 11/12/2023   PLT 309.0 11/12/2023   Lab Results  Component Value Date   NA 138 11/12/2023   K 4.2 11/12/2023   CO2 31 11/12/2023   GLUCOSE 97 11/12/2023   BUN 11 11/12/2023   CREATININE 1.00 11/12/2023   BILITOT 0.9 11/12/2023   ALKPHOS 62 11/12/2023   AST 20 11/12/2023   ALT 19 11/12/2023   PROT 7.5 11/12/2023   ALBUMIN 4.6  11/12/2023   CALCIUM  9.4 11/12/2023   ANIONGAP 9 04/13/2022   GFR 81.53 11/12/2023   Lab Results  Component Value Date   CHOL 121 11/12/2023   Lab Results  Component Value Date   HDL 48.00 11/12/2023   Lab Results  Component Value Date   LDLCALC 61 11/12/2023   Lab Results  Component Value Date   TRIG 61.0 11/12/2023   Lab Results  Component Value Date   CHOLHDL 3 11/12/2023   Lab Results  Component Value Date   HGBA1C 5.2 11/14/2021       Assessment & Plan:  Arm mass, left Assessment & Plan: ? Lipoma  No pain , no d/c Monitor --- if it becomes painful or increases in size rto   Chronic left shoulder pain Assessment & Plan: Pt will f/u with ortho     Jamee JONELLE Antonio Cyndee, DO

## 2024-01-14 NOTE — Assessment & Plan Note (Signed)
?   Lipoma  No pain , no d/c Monitor --- if it becomes painful or increases in size rto

## 2024-01-14 NOTE — Assessment & Plan Note (Signed)
 Pt will f/u with ortho.

## 2024-02-11 ENCOUNTER — Other Ambulatory Visit: Payer: Self-pay | Admitting: Acute Care

## 2024-02-11 DIAGNOSIS — Z87891 Personal history of nicotine dependence: Secondary | ICD-10-CM

## 2024-02-11 DIAGNOSIS — Z122 Encounter for screening for malignant neoplasm of respiratory organs: Secondary | ICD-10-CM

## 2024-05-05 ENCOUNTER — Ambulatory Visit: Admitting: Family Medicine
